# Patient Record
Sex: Female | Born: 1975 | Race: White | Hispanic: No | Marital: Married | Smoking: Never smoker
Health system: Southern US, Community
[De-identification: ages and names within clinical notes are randomized; demographics above are authoritative.]

## PROBLEM LIST (undated history)

## (undated) DIAGNOSIS — O09819 Supervision of pregnancy resulting from assisted reproductive technology, unspecified trimester: Secondary | ICD-10-CM

## (undated) DIAGNOSIS — E038 Other specified hypothyroidism: Secondary | ICD-10-CM

## (undated) DIAGNOSIS — I10 Essential (primary) hypertension: Secondary | ICD-10-CM

## (undated) DIAGNOSIS — F411 Generalized anxiety disorder: Secondary | ICD-10-CM

## (undated) DIAGNOSIS — O24410 Gestational diabetes mellitus in pregnancy, diet controlled: Secondary | ICD-10-CM

## (undated) DIAGNOSIS — J45909 Unspecified asthma, uncomplicated: Secondary | ICD-10-CM

## (undated) DIAGNOSIS — E063 Autoimmune thyroiditis: Secondary | ICD-10-CM

## (undated) DIAGNOSIS — E669 Obesity, unspecified: Secondary | ICD-10-CM

## (undated) DIAGNOSIS — E039 Hypothyroidism, unspecified: Secondary | ICD-10-CM

## (undated) HISTORY — PX: MYOMECTOMY: SHX85

---

## 2005-11-01 ENCOUNTER — Emergency Department (HOSPITAL_COMMUNITY): Admission: EM | Admit: 2005-11-01 | Discharge: 2005-11-01 | Payer: Self-pay | Admitting: Emergency Medicine

## 2008-05-16 IMAGING — US US PELVIS COMPLETE
1 series · 13 of 25 positions shown · non-contrast
Comparison: NONE

CLINICAL DATA: Sharp right-side pelvic pain. LMP 11/19/06. 

PELVIC ULTRASOUND

[Series 1: us pelvis · 0.33mm/px · 13 of 65 slices shown]
[im 1/65]
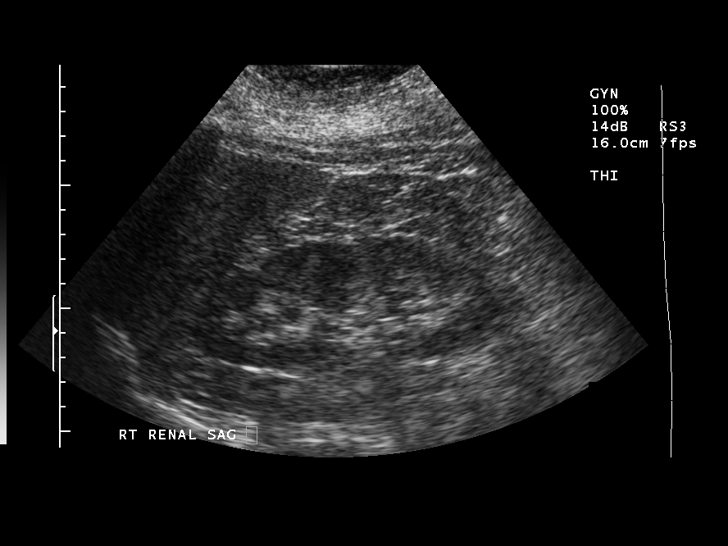
[im 6/65]
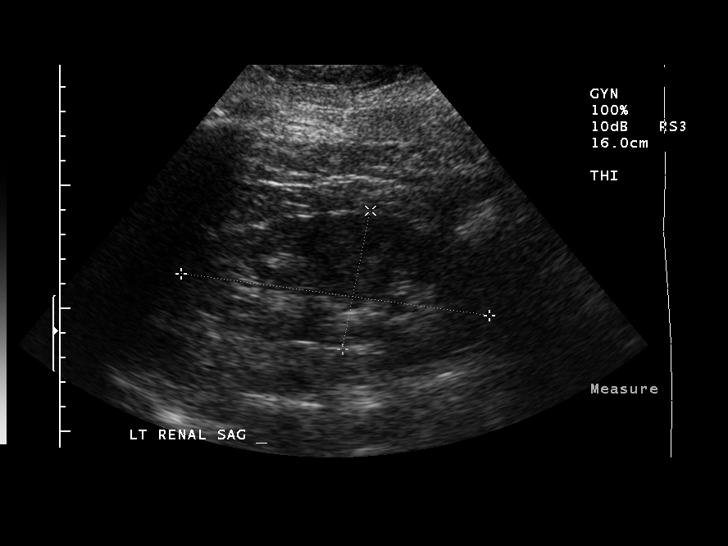
[im 11/65]
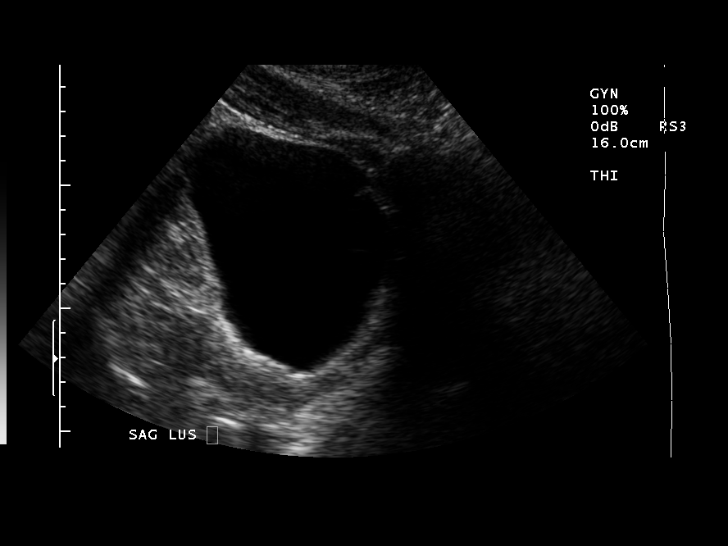
[im 17/65]
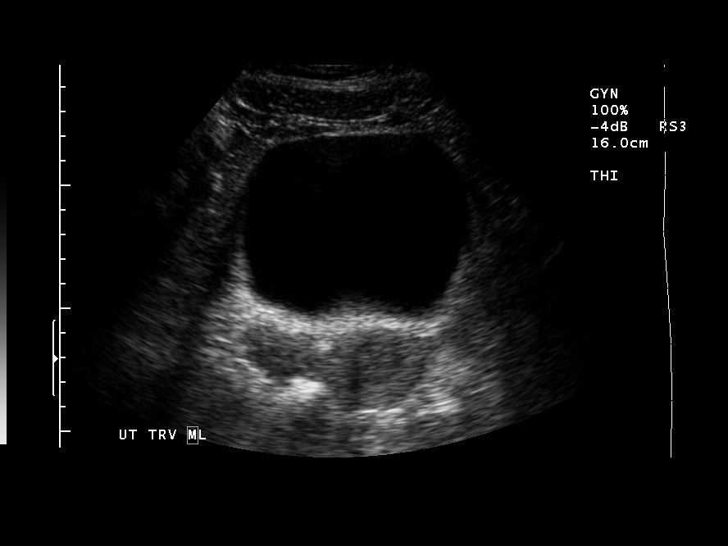
[im 22/65]
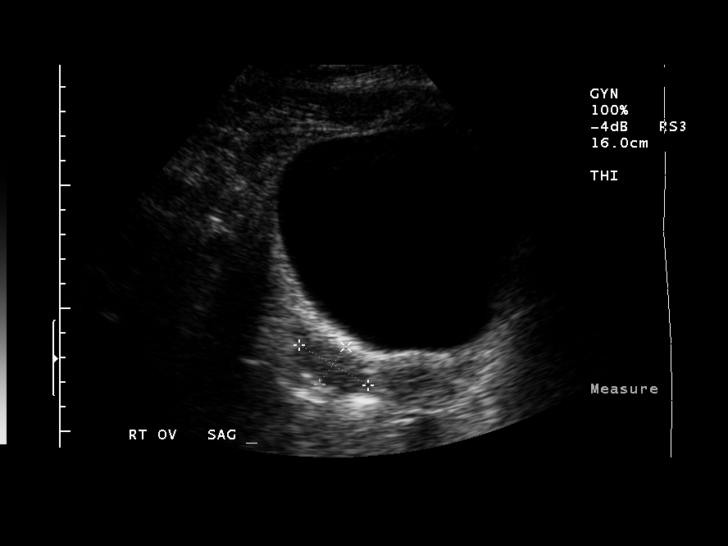
[im 27/65]
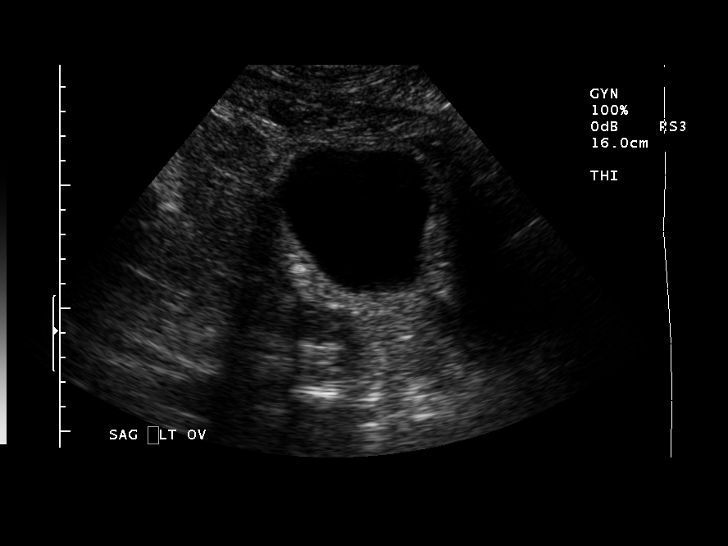
[im 33/65]
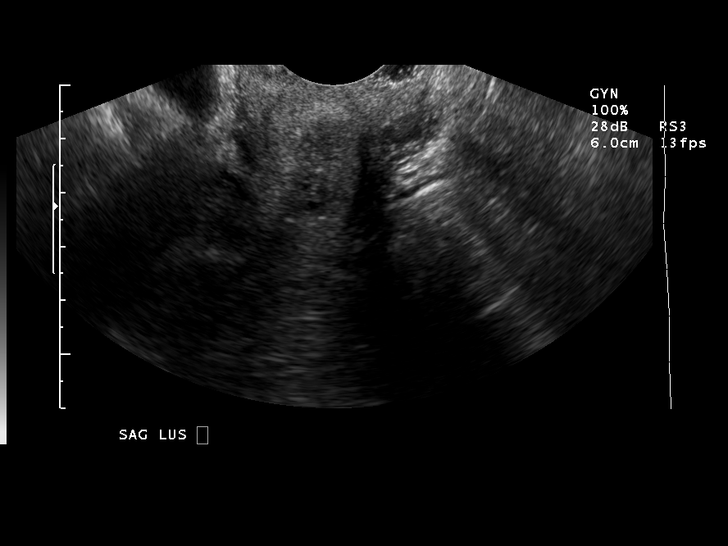
[im 38/65]
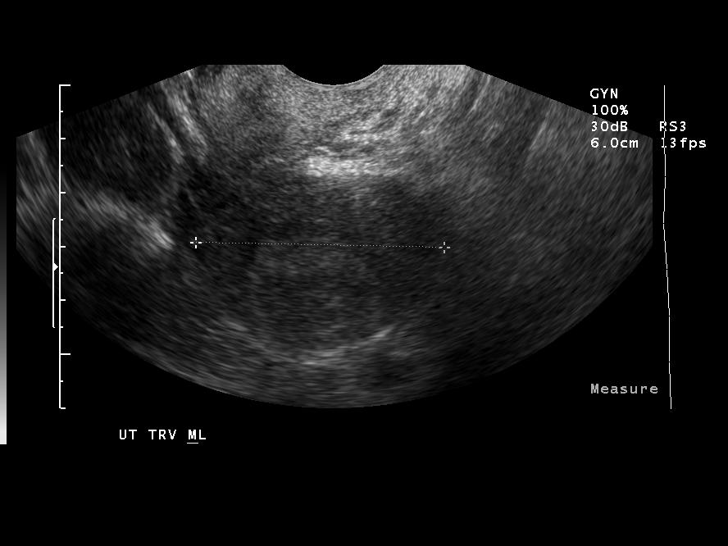
[im 43/65]
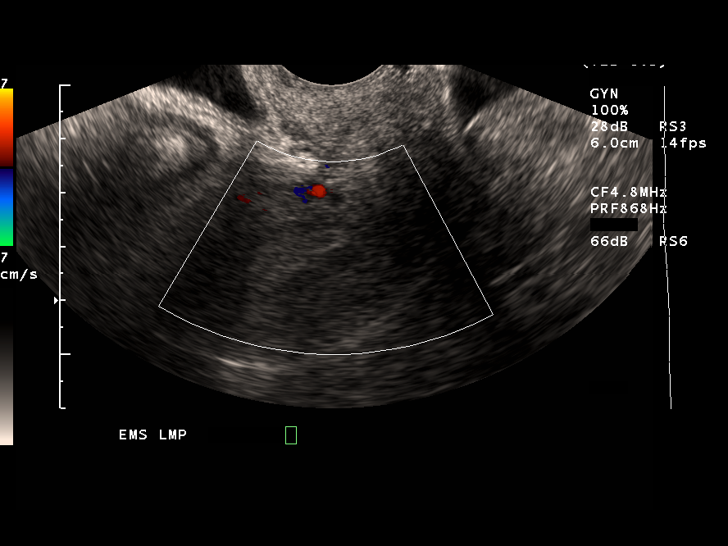
[im 49/65]
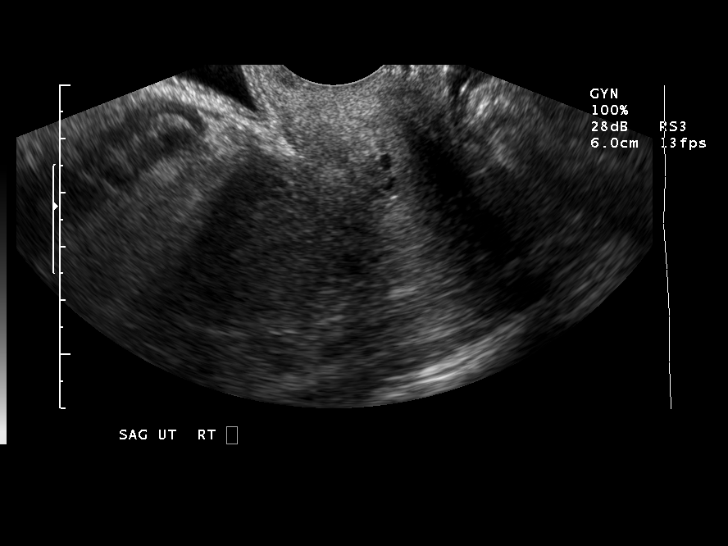
[im 54/65]
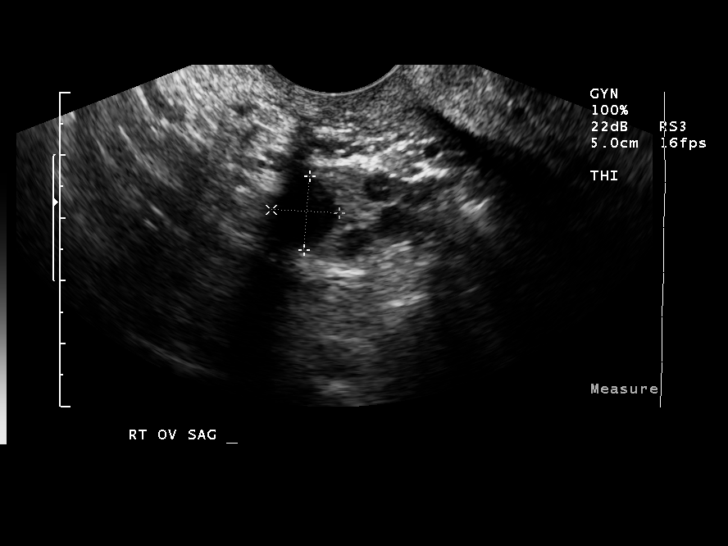
[im 59/65]
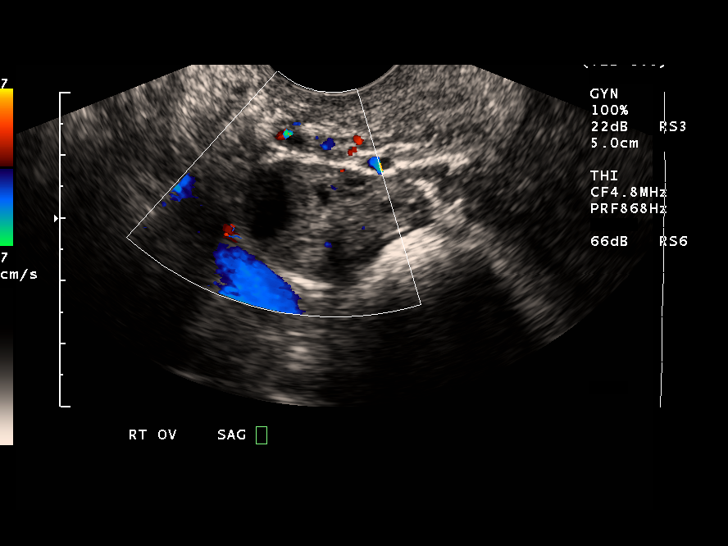
[im 65/65]
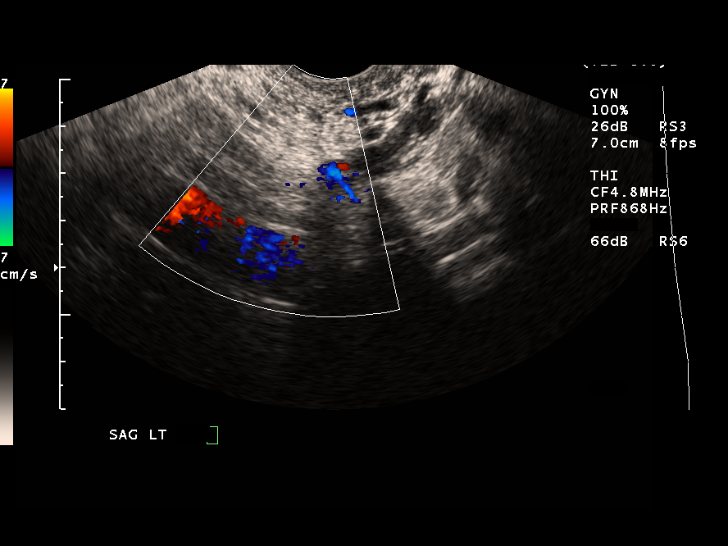

[13 of 25 positions shown; findings below may reference images not displayed]

FINDINGS: Transabdominal and transvaginal scans were performed. 
Limited views of both kidneys were normal. The uterus is normal in 
size measuring 7.8 x 3.8 x 4.7 cm in sagittal, AP, and transverse 
dimensions. The echo pattern is mildly heterogeneous. The 
endometrial stripe is normal in thickness and echo appearance 
measuring 0.81 cm. A tiny cervical cyst is noted. The left ovary 
was visualized on the transabdominal scan only and measured 3.0 x 
2.0 x 1.8 cm. Size was normal, but echo appearance was difficult 
to evaluate. The right ovary measured 3.1 x 1.9 x 1.8 cm. A 1.1-cm 
follicle was noted in the right ovary. Peripheral scattered 
calcifications were noted, which are most likely dystrophic. There 
were no pelvic fluid collections.
IMPRESSION: 1.1-cm right ovarian follicle or small cyst. Probable 
dystrophic calcifications in the right ovary. I do recommend a 
follow-up study to demonstrate stability of these calcifications 
with repeat exam in 3-6 months. Left ovary was not optimally 
visualized. Blain Jumper, M.D. electronically reviewed on 
12/17/2006 Dict Date: 12/17/2006  Tran Date: 12/17/2006 CAV  [REDACTED]

## 2016-11-05 LAB — OB RESULTS CONSOLE RUBELLA ANTIBODY, IGM: Rubella: IMMUNE

## 2016-11-05 LAB — OB RESULTS CONSOLE ABO/RH: RH TYPE: POSITIVE

## 2016-11-05 LAB — OB RESULTS CONSOLE HIV ANTIBODY (ROUTINE TESTING): HIV: NONREACTIVE

## 2016-11-05 LAB — OB RESULTS CONSOLE ANTIBODY SCREEN: Antibody Screen: NEGATIVE

## 2016-11-05 LAB — OB RESULTS CONSOLE RPR: RPR: NONREACTIVE

## 2016-11-05 LAB — OB RESULTS CONSOLE GC/CHLAMYDIA
CHLAMYDIA, DNA PROBE: NEGATIVE
Gonorrhea: NEGATIVE

## 2016-11-05 LAB — OB RESULTS CONSOLE HEPATITIS B SURFACE ANTIGEN: Hepatitis B Surface Ag: NEGATIVE

## 2017-02-13 ENCOUNTER — Inpatient Hospital Stay (HOSPITAL_COMMUNITY): Payer: 59

## 2017-02-13 ENCOUNTER — Inpatient Hospital Stay (HOSPITAL_COMMUNITY)
Admission: AD | Admit: 2017-02-13 | Discharge: 2017-02-21 | DRG: 788 | Disposition: A | Discharge status: Home / Self Care | Payer: 59 | Source: Ambulatory Visit | Attending: Obstetrics & Gynecology | Admitting: Obstetrics & Gynecology

## 2017-02-13 ENCOUNTER — Encounter: Payer: Self-pay | Admitting: Student

## 2017-02-13 DIAGNOSIS — J45909 Unspecified asthma, uncomplicated: Secondary | ICD-10-CM | POA: Diagnosis present

## 2017-02-13 DIAGNOSIS — E669 Obesity, unspecified: Secondary | ICD-10-CM | POA: Diagnosis present

## 2017-02-13 DIAGNOSIS — O99214 Obesity complicating childbirth: Secondary | ICD-10-CM | POA: Diagnosis present

## 2017-02-13 DIAGNOSIS — O9952 Diseases of the respiratory system complicating childbirth: Secondary | ICD-10-CM | POA: Diagnosis present

## 2017-02-13 DIAGNOSIS — O09512 Supervision of elderly primigravida, second trimester: Secondary | ICD-10-CM

## 2017-02-13 DIAGNOSIS — O9902 Anemia complicating childbirth: Secondary | ICD-10-CM | POA: Diagnosis present

## 2017-02-13 DIAGNOSIS — O09811 Supervision of pregnancy resulting from assisted reproductive technology, first trimester: Secondary | ICD-10-CM

## 2017-02-13 DIAGNOSIS — O09819 Supervision of pregnancy resulting from assisted reproductive technology, unspecified trimester: Secondary | ICD-10-CM

## 2017-02-13 DIAGNOSIS — O288 Other abnormal findings on antenatal screening of mother: Secondary | ICD-10-CM

## 2017-02-13 DIAGNOSIS — Z3A24 24 weeks gestation of pregnancy: Secondary | ICD-10-CM | POA: Diagnosis not present

## 2017-02-13 DIAGNOSIS — O99284 Endocrine, nutritional and metabolic diseases complicating childbirth: Secondary | ICD-10-CM | POA: Diagnosis present

## 2017-02-13 DIAGNOSIS — F411 Generalized anxiety disorder: Secondary | ICD-10-CM | POA: Diagnosis present

## 2017-02-13 DIAGNOSIS — I10 Essential (primary) hypertension: Secondary | ICD-10-CM | POA: Diagnosis present

## 2017-02-13 DIAGNOSIS — D649 Anemia, unspecified: Secondary | ICD-10-CM | POA: Diagnosis present

## 2017-02-13 DIAGNOSIS — O24420 Gestational diabetes mellitus in childbirth, diet controlled: Secondary | ICD-10-CM | POA: Diagnosis present

## 2017-02-13 DIAGNOSIS — N289 Disorder of kidney and ureter, unspecified: Secondary | ICD-10-CM | POA: Diagnosis present

## 2017-02-13 DIAGNOSIS — O119 Pre-existing hypertension with pre-eclampsia, unspecified trimester: Secondary | ICD-10-CM | POA: Diagnosis present

## 2017-02-13 DIAGNOSIS — O99344 Other mental disorders complicating childbirth: Secondary | ICD-10-CM | POA: Diagnosis present

## 2017-02-13 DIAGNOSIS — E039 Hypothyroidism, unspecified: Secondary | ICD-10-CM | POA: Diagnosis present

## 2017-02-13 DIAGNOSIS — O114 Pre-existing hypertension with pre-eclampsia, complicating childbirth: Principal | ICD-10-CM | POA: Diagnosis present

## 2017-02-13 DIAGNOSIS — O1410 Severe pre-eclampsia, unspecified trimester: Secondary | ICD-10-CM | POA: Diagnosis present

## 2017-02-13 DIAGNOSIS — O1412 Severe pre-eclampsia, second trimester: Secondary | ICD-10-CM

## 2017-02-13 DIAGNOSIS — O24410 Gestational diabetes mellitus in pregnancy, diet controlled: Secondary | ICD-10-CM | POA: Diagnosis present

## 2017-02-13 DIAGNOSIS — O1002 Pre-existing essential hypertension complicating childbirth: Secondary | ICD-10-CM | POA: Diagnosis present

## 2017-02-13 DIAGNOSIS — Z363 Encounter for antenatal screening for malformations: Secondary | ICD-10-CM

## 2017-02-13 DIAGNOSIS — O99213 Obesity complicating pregnancy, third trimester: Secondary | ICD-10-CM

## 2017-02-13 DIAGNOSIS — O321XX0 Maternal care for breech presentation, not applicable or unspecified: Secondary | ICD-10-CM | POA: Diagnosis present

## 2017-02-13 DIAGNOSIS — O09522 Supervision of elderly multigravida, second trimester: Secondary | ICD-10-CM

## 2017-02-13 HISTORY — DX: Hypothyroidism, unspecified: E03.9

## 2017-02-13 HISTORY — DX: Generalized anxiety disorder: F41.1

## 2017-02-13 HISTORY — DX: Supervision of pregnancy resulting from assisted reproductive technology, unspecified trimester: O09.819

## 2017-02-13 HISTORY — DX: Gestational diabetes mellitus in pregnancy, diet controlled: O24.410

## 2017-02-13 HISTORY — DX: Other specified hypothyroidism: E03.8

## 2017-02-13 HISTORY — DX: Autoimmune thyroiditis: E06.3

## 2017-02-13 HISTORY — DX: Obesity, unspecified: E66.9

## 2017-02-13 HISTORY — DX: Unspecified asthma, uncomplicated: J45.909

## 2017-02-13 HISTORY — DX: Essential (primary) hypertension: I10

## 2017-02-13 LAB — CBC
HEMATOCRIT: 38.6 % (ref 36.0–46.0)
Hemoglobin: 13.3 g/dL (ref 12.0–15.0)
MCH: 28.2 pg (ref 26.0–34.0)
MCHC: 34.5 g/dL (ref 30.0–36.0)
MCV: 81.8 fL (ref 78.0–100.0)
Platelets: 166 10*3/uL (ref 150–400)
RBC: 4.72 MIL/uL (ref 3.87–5.11)
RDW: 14 % (ref 11.5–15.5)
WBC: 15.3 10*3/uL — ABNORMAL HIGH (ref 4.0–10.5)

## 2017-02-13 LAB — COMPREHENSIVE METABOLIC PANEL
ALK PHOS: 81 U/L (ref 38–126)
ALT: 64 U/L — ABNORMAL HIGH (ref 14–54)
ANION GAP: 8 (ref 5–15)
AST: 55 U/L — ABNORMAL HIGH (ref 15–41)
Albumin: 2.8 g/dL — ABNORMAL LOW (ref 3.5–5.0)
BILIRUBIN TOTAL: 0.8 mg/dL (ref 0.3–1.2)
BUN: 11 mg/dL (ref 6–20)
CALCIUM: 8.5 mg/dL — AB (ref 8.9–10.3)
CO2: 15 mmol/L — ABNORMAL LOW (ref 22–32)
Chloride: 110 mmol/L (ref 101–111)
Creatinine, Ser: 0.84 mg/dL (ref 0.44–1.00)
GFR calc Af Amer: >60 mL/min (ref >60)
Glucose, Bld: 99 mg/dL (ref 65–99)
POTASSIUM: 4.5 mmol/L (ref 3.5–5.1)
Sodium: 133 mmol/L — ABNORMAL LOW (ref 135–145)
TOTAL PROTEIN: 5.9 g/dL — AB (ref 6.5–8.1)

## 2017-02-13 LAB — PROTEIN / CREATININE RATIO, URINE
CREATININE, URINE: 520 mg/dL
Protein Creatinine Ratio: 4.19 mg/mg{Cre} — ABNORMAL HIGH (ref 0.00–0.15)
TOTAL PROTEIN, URINE: 2179 mg/dL

## 2017-02-13 LAB — TYPE AND SCREEN
ABO/RH(D): O POS
ANTIBODY SCREEN: NEGATIVE

## 2017-02-13 LAB — URINALYSIS, ROUTINE W REFLEX MICROSCOPIC
Glucose, UA: NEGATIVE mg/dL
Ketones, ur: 15 mg/dL — AB
Leukocytes, UA: NEGATIVE
Nitrite: NEGATIVE
PH: 6 (ref 5.0–8.0)
Protein, ur: >300 mg/dL — AB
Specific Gravity, Urine: >1.03 — ABNORMAL HIGH (ref 1.005–1.030)

## 2017-02-13 LAB — URINALYSIS, MICROSCOPIC (REFLEX)

## 2017-02-13 LAB — URIC ACID: URIC ACID, SERUM: 4.3 mg/dL (ref 2.3–6.6)

## 2017-02-13 LAB — LACTATE DEHYDROGENASE: LDH: 350 U/L — ABNORMAL HIGH (ref 98–192)

## 2017-02-13 MED ORDER — PANTOPRAZOLE SODIUM 40 MG IV SOLR
40.0000 mg | INTRAVENOUS | Status: DC
  Administered 2017-02-13 – 2017-02-15 (×3): 40 mg via INTRAVENOUS
  Filled 2017-02-13 (×4): qty 40

## 2017-02-13 MED ORDER — NIFEDIPINE ER 30 MG PO TB24
30.0000 mg | ORAL_TABLET | Freq: Every day | ORAL | Status: DC
  Filled 2017-02-13: qty 1

## 2017-02-13 MED ORDER — MAGNESIUM SULFATE 40 G IN LACTATED RINGERS - SIMPLE
2.0000 g/h | INTRAVENOUS | Status: DC
Start: 2017-02-13 — End: 2017-02-16
  Administered 2017-02-14 – 2017-02-16 (×3): 2 g/h via INTRAVENOUS
  Filled 2017-02-13 (×3): qty 40
  Filled 2017-02-13: qty 500

## 2017-02-13 MED ORDER — HYDRALAZINE HCL 20 MG/ML IJ SOLN
10.0000 mg | Freq: Once | INTRAMUSCULAR | Status: AC | PRN
  Administered 2017-02-13: 10 mg via INTRAVENOUS
  Filled 2017-02-13: qty 1

## 2017-02-13 MED ORDER — LABETALOL HCL 5 MG/ML IV SOLN
20.0000 mg | INTRAVENOUS | Status: AC | PRN
  Administered 2017-02-13: 80 mg via INTRAVENOUS
  Administered 2017-02-13: 20 mg via INTRAVENOUS
  Administered 2017-02-13: 40 mg via INTRAVENOUS
  Filled 2017-02-13: qty 16
  Filled 2017-02-13: qty 4
  Filled 2017-02-13: qty 8

## 2017-02-13 MED ORDER — LACTATED RINGERS IV SOLN
INTRAVENOUS | Status: DC
  Administered 2017-02-14 – 2017-02-17 (×6): via INTRAVENOUS

## 2017-02-13 MED ORDER — PRENATAL MULTIVITAMIN CH
1.0000 | ORAL_TABLET | Freq: Every day | ORAL | Status: DC
  Filled 2017-02-13 (×3): qty 1

## 2017-02-13 MED ORDER — MAGNESIUM SULFATE 40 G IN LACTATED RINGERS - SIMPLE
2.0000 g/h | Freq: Once | INTRAVENOUS | Status: AC
  Administered 2017-02-13: 2 g/h via INTRAVENOUS
  Filled 2017-02-13: qty 40

## 2017-02-13 MED ORDER — HYDRALAZINE HCL 20 MG/ML IJ SOLN
INTRAMUSCULAR | Status: AC
  Filled 2017-02-13: qty 1

## 2017-02-13 MED ORDER — SODIUM CHLORIDE 0.9 % IV SOLN
INTRAVENOUS | Status: DC
  Administered 2017-02-13: 21:00:00 via INTRAVENOUS

## 2017-02-13 MED ORDER — ONDANSETRON HCL 4 MG/2ML IJ SOLN
4.0000 mg | Freq: Once | INTRAMUSCULAR | Status: AC
  Administered 2017-02-13: 4 mg via INTRAVENOUS
  Filled 2017-02-13: qty 2

## 2017-02-13 MED ORDER — BETAMETHASONE SOD PHOS & ACET 6 (3-3) MG/ML IJ SUSP
12.0000 mg | INTRAMUSCULAR | Status: DC
  Filled 2017-02-13: qty 2

## 2017-02-13 MED ORDER — CALCIUM CARBONATE ANTACID 500 MG PO CHEW: 2.0000 | CHEWABLE_TABLET | ORAL | Status: DC | PRN

## 2017-02-13 MED ORDER — MAGNESIUM SULFATE 40 G IN LACTATED RINGERS - SIMPLE: 2.0000 g/h | Freq: Once | INTRAVENOUS | Status: DC

## 2017-02-13 MED ORDER — ACETAMINOPHEN 500 MG PO TABS
1000.0000 mg | ORAL_TABLET | Freq: Once | ORAL | Status: AC
  Administered 2017-02-13: 1000 mg via ORAL
  Filled 2017-02-13: qty 2

## 2017-02-13 MED ORDER — LACTATED RINGERS IV BOLUS (SEPSIS)
1000.0000 mL | Freq: Once | INTRAVENOUS | Status: DC
  Administered 2017-02-13: 1000 mL via INTRAVENOUS

## 2017-02-13 MED ORDER — ZOLPIDEM TARTRATE 5 MG PO TABS
5.0000 mg | ORAL_TABLET | Freq: Every evening | ORAL | Status: DC | PRN
  Administered 2017-02-13 – 2017-02-20 (×7): 5 mg via ORAL
  Filled 2017-02-13 (×7): qty 1

## 2017-02-13 MED ORDER — BETAMETHASONE SOD PHOS & ACET 6 (3-3) MG/ML IJ SUSP
12.0000 mg | Freq: Once | INTRAMUSCULAR | Status: AC
  Administered 2017-02-14: 12 mg via INTRAMUSCULAR
  Filled 2017-02-13: qty 2

## 2017-02-13 MED ORDER — ALPRAZOLAM 0.5 MG PO TABS
0.5000 mg | ORAL_TABLET | Freq: Once | ORAL | Status: AC
  Administered 2017-02-14: 0.5 mg via ORAL
  Filled 2017-02-13: qty 1

## 2017-02-13 MED ORDER — LABETALOL HCL 200 MG PO TABS
400.0000 mg | ORAL_TABLET | Freq: Three times a day (TID) | ORAL | Status: DC
  Administered 2017-02-13 – 2017-02-16 (×8): 400 mg via ORAL
  Filled 2017-02-13 (×9): qty 2

## 2017-02-13 MED ORDER — DOCUSATE SODIUM 100 MG PO CAPS
100.0000 mg | ORAL_CAPSULE | Freq: Every day | ORAL | Status: DC
  Administered 2017-02-14: 100 mg via ORAL
  Filled 2017-02-13 (×5): qty 1

## 2017-02-13 MED ORDER — BETAMETHASONE SOD PHOS & ACET 6 (3-3) MG/ML IJ SUSP
12.0000 mg | Freq: Once | INTRAMUSCULAR | Status: AC
Start: 2017-02-13 — End: 2017-02-13
  Administered 2017-02-13: 12 mg via INTRAMUSCULAR
  Filled 2017-02-13: qty 2

## 2017-02-13 MED ORDER — ACETAMINOPHEN 325 MG PO TABS: 650.0000 mg | ORAL_TABLET | ORAL | Status: DC | PRN

## 2017-02-13 NOTE — MAU Note (Signed)
Pt reports back pain since yesterday, upper abd pain started later. Vomiting today. Denies diarrhea or fever.

## 2017-02-13 NOTE — H&P (Signed)
Chief complaint: Nausea vomiting headache and hypertension  History of present illness: 42 year old G1 at 24 weeks and 2 days with history of chronic hypertension presents with multiple symptoms consistent with severe preeclampsia. Patient has had her chronic hypertension well controlled with clobetasol 200 mg by mouth twice a day. She reports home blood pressures 130s over 80s but has not taken her blood pressure today. She took her daytime dose of labetalol but had emesis immediately thereafter and thinks she did not absorb her medication. Patient notes headache starting last night which did show some response to Tylenol however her headache today did not respond to Tylenol with codeine. Patient thinks her headache is stemming from her nausea. Patient notes nausea starting last night with initial response to Zofran. Patient repeated Zofran today but this did not help her nausea and she notes continued emesis and "unable to keep anything down". Patient reports no scotomata. Patient is not on any medications for reflux. Patient notes history of whitecoat hypertension and her blood pressures during her outpatient obstetric visit have been markedly higher than her reported blood pressures at home. Patient has a history of anxiety but is not currently on any antianxiety medications. Patient notes no contractions, no leakage of fluid, active fetal movement and no vaginal bleeding. Patient does note upper epigastric pain and back pain. The back pain has been responsive to the codeine earlier today.   Prenatal issues:  - IVF pregnancy. Patient did not do preimplantation genetic diagnosis - Advanced maternal age at 48. Normal NT screen, declined spell free fetal DNA - Hypothyroid. Followed at [facility] integrative therapy with stable TSH early January - Chronic hypertension with whitecoat component. Patient was on Carver Dyal at the start of the pregnancy but discontinued this around 10 weeks. Her home blood  pressures have been under adequate control per patient's blood pressure log on labetalol 200 mg twice a day. Given her chronic hypertension patient had baseline labs for preeclampsia at the start of December. At that time her creatinine was 0.95 her AST was 20 her ALT was 29 her hemoglobin was 13.0 and her platelets were 311. Her baseline 24-hour urine on January 4 showed 284 mg of protein. - Early subchorionic hemorrhage. No bleeding through second trimester - Placenta previa on 16 and 19 week ultrasound. This has not yet been reassessed - Insulin resistance. Patient had an early 1 hour GTT of 180. She has been checking home blood sugars and they have been under good control on no medications. Patient used 250 mg of metformin for a short time - Asthma. No active issues - Obesity  Medications: Labetalol, prenatal vitamin, Zyrtec, baby aspirin Allergies: Penicillin, sulfa, tetracycline, shellfish  Past Medical History:  Diagnosis Date  . Asthma   . Chronic hypertension   . Hypothyroidism due to Hashimoto's thyroiditis     Past Surgical History:  Procedure Laterality Date  . MYOMECTOMY      PE: Vitals:   02/13/17 1914 02/13/17 1930 02/13/17 1945 02/13/17 2000  BP: (!) 190/121 (!) 182/108 (!) 186/115 (!) 173/101  Pulse:  79 89 87  Resp:      Temp:      TempSrc:      SpO2:      Weight:      Height:       General: Laying in bed in no acute distress, no significant anxiety apparent Cardiovascular: Regular rate and rhythm, no murmur rub or gallop Pulmonary: Clear to auscultation Abdomen: No fundal tenderness, no abdominal  tenderness, no right upper quadrant pain Lower extremity: 3+ edema, 2+ DTR, no clonus GU: Not assessed (placenta previa) Toco: None NST: 140s, 10 beat variability, possible variable decelerations though difficult to follow due to early gestational age and maternal habitus  CBC    Component Value Date/Time   WBC 15.3 (H) 02/13/2017 1915   RBC 4.72 02/13/2017  1915   HGB 13.3 02/13/2017 1915   HCT 38.6 02/13/2017 1915   PLT 166 02/13/2017 1915   MCV 81.8 02/13/2017 1915   MCH 28.2 02/13/2017 1915   MCHC 34.5 02/13/2017 1915   RDW 14.0 02/13/2017 1915    CMP Latest Ref Rng & Units 02/13/2017  Glucose 65 - 99 mg/dL 99  BUN 6 - 20 mg/dL 11  Creatinine 0.980.44 - 1.191.00 mg/dL 1.470.84  Sodium 829135 - 562145 mmol/L 133(L)  Potassium 3.5 - 5.1 mmol/L 4.5  Chloride 101 - 111 mmol/L 110  CO2 22 - 32 mmol/L 15(L)  Calcium 8.9 - 10.3 mg/dL 1.3(Y8.5(L)  Total Protein 6.5 - 8.1 g/dL 5.9(L)  Total Bilirubin 0.3 - 1.2 mg/dL 0.8  Alkaline Phos 38 - 126 U/L 81  AST 15 - 41 U/L 55(H)  ALT 14 - 54 U/L 64(H)    Assessment plan: 42 year old G1 at 24 weeks and 2 days gestation with chronic hypertension, whitecoat hypertension, obesity, thyroid disease, insulin resistance here with severe preeclampsia. Patient with headache and elevated blood pressures. Currently attempting to control blood pressures with IV labetalol. She has had 20, 40, 80 mg of labetalol. Blood pressures failed to resolve will move to IV hydralazine. Patient has received 1 dose of betamethasone. We will start magnesium sulfate. I suspect there is an anxiety component her blood pressures and we will give her some Xanax. She will get Zofran and protonic study help with her nausea and acid symptoms. We'll keep the fetus on the monitor until we get a bedside ultrasound for placental location growth AFI, BPP and Doppler studies. Due to the early gestational age all attempts will be made for expectant management. Will repeat labs in 6 hours for stability. I discussed with patient showed her blood pressures be unable to be controlled, any seizures or worsening neurologic symptoms or worsening labs we may consider delivery. All attempts were made to complete betamethasone prior to delivery. Due to early gestational age this would be via primary cesarean section.  Lendon ColonelKelly A Kalan Rinn 02/13/2017 8:13 PM

## 2017-02-13 NOTE — Progress Notes (Signed)
Subjective: Patient notes continues to be worried about her situation but anxiety slightly diminished after Xanax and Ambien. Patient is thirsty andcomplains of dry air in the room. Patient notes headache improved.  O: Vitals:   02/13/17 2136 02/13/17 2142 02/13/17 2202 02/13/17 2222  BP:  (!) 174/96 (!) 166/90 (!) 184/105  Pulse:  87 86 92  Resp: 16 16    Temp: 97.6 F (36.4 C)     TempSrc: Oral     SpO2:      Weight: 116.6 kg (257 lb)     Height: 5\' 5"  (1.651 m)      Gen.: Well-appearing, no distress Toco: None :FH: 150s 5-10 beat variability, no decelerations,appropriate for gestational age  Urine protein creatinine: 4.19 Bedside ultrasound: Formal report not yet available but on my interpretation of the images: No longer placenta previa, good fetal movement and grossly normal AFI. AGA  Assessment plan: Severe preeclampsia. Patient is aware of her situation with her very high blood pressures. Patient continues to assert that her blood pressures are due to anxiety and white coat syndrome. We discussed that she has a diagnosis of preeclampsia as evidence by her urinary protein and elevated LFTs. Her platelets are also below her starting platelets count with a drop from 300-160.Patient is resistant to the idea of the delivery. Since moving to the labor floor her pressures have been under better control after receiving labetalol 20 40, 80 and 10 mg of IV hydralazine. Recent bump in blood pressure was while we were discussing her plan of care. Will repeat blood pressure and one hour if this remains elevated we will repeat the IV hydralazine. We will also start the patient on labetalol 400 mg every 8 hours. Will hold Procardia for now given the magnesium. Patient aware mode of delivery would be by C-section and we would move to delivery with persistently high blood pressures, maternal symptoms, worsening kidney function or fetal distress. Labs be repeated 6 hours from her last and if they are  stable move to every 12 hours,  Lendon ColonelKelly A Jaleen Grupp 02/13/2017 10:57 PM

## 2017-02-13 NOTE — MAU Provider Note (Signed)
History     CSN: [id number]  Arrival date and time: 02/13/17 1827   First Provider Initiated Contact with Patient 02/13/17 1848      Chief Complaint  Patient presents with  . Abdominal Pain  . Back Pain  . Emesis   HPI  Natasha Caldwell is a 42 y.o. G1P0 at 7178w2d who presents with n/v, epigastric pain, & headache. Pregnancy complicated by chronic hypertension & is currently taking labetalol 200 mg BID. Current symptoms began last night with mid back pain. Reports pain radiated to upper abdomen & earlier today had epigastric pain that was constant/throbbing. States pain has improved without intervention & currently rates pain 4/10. Started vomiting this morning. States she has vomited numerous times today and continues to be nauseated. Thinks she vomited her medication up today. Headache started more recently; thinks headache is due to vomiting. Denies visual disturbance. No sick contacts. Temp 99 at home. Positive fetal movement.   OB History    Gravida Para Term Preterm AB Living   1             SAB TAB Ectopic Multiple Live Births                  Past Medical History:  Diagnosis Date  . Asthma   . Chronic hypertension   . Hypothyroidism due to Hashimoto's thyroiditis     Past Surgical History:  Procedure Laterality Date  . MYOMECTOMY      Family History  Problem Relation Age of Onset  . Leukemia Sister   . Diabetes Maternal Grandfather   . Heart attack Maternal Grandfather   . Diabetes Paternal Grandfather   . Heart attack Paternal Grandfather     Social History   Tobacco Use  . Smoking status: Not on file  Substance Use Topics  . Alcohol use: Not on file  . Drug use: Not on file    Allergies:  Allergies  Allergen Reactions  . Monistat [Miconazole] Hives  . Penicillins Hives  . Shellfish Allergy Hives  . Sulfa Antibiotics Hives  . Tetracyclines & Related Hives    Medications Prior to Admission  Medication Sig Dispense Refill Last Dose  .  cholecalciferol (VITAMIN D) 1000 units tablet Take 1,000 Units by mouth daily.     Marland Kitchen. labetalol (NORMODYNE) 200 MG tablet Take 200 mg by mouth 2 (two) times daily.   02/13/2017 at Unknown time  . metFORMIN (GLUCOPHAGE) 500 MG tablet Take 250 mg by mouth daily.       Review of Systems  Constitutional: Negative.   Eyes: Negative for visual disturbance.  Gastrointestinal: Positive for abdominal pain, nausea and vomiting. Negative for constipation and diarrhea.  Genitourinary: Negative.   Musculoskeletal: Positive for back pain.  Neurological: Positive for headaches.   Physical Exam   Blood pressure (!) 210/117, pulse 89, temperature 98.2 F (36.8 C), temperature source Oral, resp. rate 16, height 5\' 5"  (1.651 m), weight 257 lb (116.6 kg), SpO2 99 %. Patient Vitals for the past 24 hrs:  BP Temp Temp src Pulse Resp SpO2 Height Weight  02/13/17 1930 (!) 182/108 - - 79 - - - -  02/13/17 1914 (!) 190/121 - - - - - - -  02/13/17 1902 (!) 177/108 - - 86 - 97 % - -  02/13/17 1841 (!) 210/117 98.2 F (36.8 C) Oral 89 16 99 % 5\' 5"  (1.651 m) 257 lb (116.6 kg)    Physical Exam  Nursing note and vitals reviewed. Constitutional: She  is oriented to person, place, and time. She appears well-developed and well-nourished. No distress.  HENT:  Head: Normocephalic and atraumatic.  Eyes: Conjunctivae are normal. Right eye exhibits no discharge. Left eye exhibits no discharge. No scleral icterus.  Neck: Normal range of motion.  Cardiovascular: Normal rate, regular rhythm and normal heart sounds.  No murmur heard. Respiratory: Effort normal and breath sounds normal. No respiratory distress. She has no wheezes.  GI: Soft. There is tenderness in the right upper quadrant. There is no CVA tenderness and negative Murphy's sign.  Musculoskeletal: She exhibits edema (BLE).  Neurological: She is alert and oriented to person, place, and time. She has normal reflexes.  No clonus  Skin: Skin is warm and dry. She is  not diaphoretic.  Psychiatric: She has a normal mood and affect. Her behavior is normal. Judgment and thought content normal.    MAU Course  Procedures Results for orders placed or performed during the hospital encounter of 02/13/17 (from the past 24 hour(s))  Urinalysis, Routine w reflex microscopic     Status: Abnormal   Collection Time: 02/13/17  6:44 PM  Result Value Ref Range   Color, Urine YELLOW YELLOW   APPearance CLEAR CLEAR   Specific Gravity, Urine >1.030 (H) 1.005 - 1.030   pH 6.0 5.0 - 8.0   Glucose, UA NEGATIVE NEGATIVE mg/dL   Hgb urine dipstick SMALL (A) NEGATIVE   Bilirubin Urine SMALL (A) NEGATIVE   Ketones, ur 15 (A) NEGATIVE mg/dL   Protein, ur >161 (A) NEGATIVE mg/dL   Nitrite NEGATIVE NEGATIVE   Leukocytes, UA NEGATIVE NEGATIVE  Urinalysis, Microscopic (reflex)     Status: Abnormal   Collection Time: 02/13/17  6:44 PM  Result Value Ref Range   RBC / HPF 0-5 0 - 5 RBC/hpf   WBC, UA 0-5 0 - 5 WBC/hpf   Bacteria, UA FEW (A) NONE SEEN   Squamous Epithelial / LPF 0-5 (A) NONE SEEN  CBC     Status: Abnormal   Collection Time: 02/13/17  7:15 PM  Result Value Ref Range   WBC 15.3 (H) 4.0 - 10.5 K/uL   RBC 4.72 3.87 - 5.11 MIL/uL   Hemoglobin 13.3 12.0 - 15.0 g/dL   HCT 09.6 04.5 - 40.9 %   MCV 81.8 78.0 - 100.0 fL   MCH 28.2 26.0 - 34.0 pg   MCHC 34.5 30.0 - 36.0 g/dL   RDW 81.1 91.4 - 78.2 %   Platelets 166 150 - 400 K/uL    MDM NST:  Baseline: 150 bpm, Variability: Good {> 6 bpm), Accelerations: Non-reactive but appropriate for gestational age and Decelerations: Intermittent tracing, due to gestational age vs maternal body habitus Preeclampsia labs & IV antihypertensive protocol ordered for severe range BPs Called Dr. Ernestina Penna & notified of patient's presentation. Will tx nausea & headache & give BMZ while in MAU. She will come see patient Zofran 4 mg IV, tylenol 1 gm PO, & BMZ ordered Assessment and Plan  A: Chronic hypertension during  pregnancy  P: Dr. Ernestina Penna on unit   Judeth Horn 02/13/2017, 7:02 PM

## 2017-02-14 ENCOUNTER — Other Ambulatory Visit: Payer: Self-pay

## 2017-02-14 LAB — COMPREHENSIVE METABOLIC PANEL
ALBUMIN: 2.8 g/dL — AB (ref 3.5–5.0)
ALK PHOS: 80 U/L (ref 38–126)
ALK PHOS: 77 U/L (ref 38–126)
ALT: 60 U/L — AB (ref 14–54)
ALT: 53 U/L (ref 14–54)
ALT: 51 U/L (ref 14–54)
ANION GAP: 8 (ref 5–15)
ANION GAP: 9 (ref 5–15)
AST: 39 U/L (ref 15–41)
AST: 33 U/L (ref 15–41)
AST: 33 U/L (ref 15–41)
Albumin: 2.8 g/dL — ABNORMAL LOW (ref 3.5–5.0)
Albumin: 2.9 g/dL — ABNORMAL LOW (ref 3.5–5.0)
Alkaline Phosphatase: 73 U/L (ref 38–126)
Anion gap: 13 (ref 5–15)
BILIRUBIN TOTAL: 0.3 mg/dL (ref 0.3–1.2)
BUN: 12 mg/dL (ref 6–20)
BUN: 16 mg/dL (ref 6–20)
BUN: 18 mg/dL (ref 6–20)
CALCIUM: 7.8 mg/dL — AB (ref 8.9–10.3)
CALCIUM: 7.7 mg/dL — AB (ref 8.9–10.3)
CHLORIDE: 109 mmol/L (ref 101–111)
CHLORIDE: 100 mmol/L — AB (ref 101–111)
CO2: 16 mmol/L — AB (ref 22–32)
CO2: 17 mmol/L — ABNORMAL LOW (ref 22–32)
CO2: 17 mmol/L — AB (ref 22–32)
CREATININE: 0.88 mg/dL (ref 0.44–1.00)
CREATININE: 1.15 mg/dL — AB (ref 0.44–1.00)
CREATININE: 1.17 mg/dL — AB (ref 0.44–1.00)
Calcium: 6.8 mg/dL — ABNORMAL LOW (ref 8.9–10.3)
Chloride: 105 mmol/L (ref 101–111)
GFR calc Af Amer: >60 mL/min (ref >60)
GFR calc non Af Amer: >60 mL/min (ref >60)
GFR calc non Af Amer: 58 mL/min — ABNORMAL LOW (ref >60)
GFR, EST NON AFRICAN AMERICAN: 57 mL/min — AB (ref >60)
GLUCOSE: 137 mg/dL — AB (ref 65–99)
Glucose, Bld: 166 mg/dL — ABNORMAL HIGH (ref 65–99)
Glucose, Bld: 151 mg/dL — ABNORMAL HIGH (ref 65–99)
POTASSIUM: 4.2 mmol/L (ref 3.5–5.1)
Potassium: 4.4 mmol/L (ref 3.5–5.1)
Potassium: 4.4 mmol/L (ref 3.5–5.1)
SODIUM: 133 mmol/L — AB (ref 135–145)
SODIUM: 130 mmol/L — AB (ref 135–145)
Sodium: 131 mmol/L — ABNORMAL LOW (ref 135–145)
TOTAL PROTEIN: 5.8 g/dL — AB (ref 6.5–8.1)
Total Bilirubin: 0.4 mg/dL (ref 0.3–1.2)
Total Bilirubin: 0.2 mg/dL — ABNORMAL LOW (ref 0.3–1.2)
Total Protein: 6.4 g/dL — ABNORMAL LOW (ref 6.5–8.1)
Total Protein: 6.3 g/dL — ABNORMAL LOW (ref 6.5–8.1)

## 2017-02-14 LAB — CBC WITH DIFFERENTIAL/PLATELET
BASOS PCT: 0 %
Basophils Absolute: 0 10*3/uL (ref 0.0–0.1)
EOS ABS: 0 10*3/uL (ref 0.0–0.7)
EOS PCT: 0 %
HCT: 39.1 % (ref 36.0–46.0)
HEMOGLOBIN: 13.3 g/dL (ref 12.0–15.0)
Lymphocytes Relative: 9 %
Lymphs Abs: 1.5 10*3/uL (ref 0.7–4.0)
MCH: 28 pg (ref 26.0–34.0)
MCHC: 34 g/dL (ref 30.0–36.0)
MCV: 82.3 fL (ref 78.0–100.0)
Monocytes Absolute: 0 10*3/uL — ABNORMAL LOW (ref 0.1–1.0)
Monocytes Relative: 0 %
NEUTROS PCT: 91 %
Neutro Abs: 14.5 10*3/uL — ABNORMAL HIGH (ref 1.7–7.7)
PLATELETS: 169 10*3/uL (ref 150–400)
RBC: 4.75 MIL/uL (ref 3.87–5.11)
RDW: 14.3 % (ref 11.5–15.5)
WBC: 16 10*3/uL — AB (ref 4.0–10.5)

## 2017-02-14 LAB — CBC
HCT: 37.8 % (ref 36.0–46.0)
HCT: 37.2 % (ref 36.0–46.0)
HEMATOCRIT: 36.6 % (ref 36.0–46.0)
HEMOGLOBIN: 12.7 g/dL (ref 12.0–15.0)
Hemoglobin: 12.3 g/dL (ref 12.0–15.0)
Hemoglobin: 12.6 g/dL (ref 12.0–15.0)
MCH: 27.9 pg (ref 26.0–34.0)
MCH: 28.3 pg (ref 26.0–34.0)
MCH: 28.2 pg (ref 26.0–34.0)
MCHC: 33.6 g/dL (ref 30.0–36.0)
MCHC: 33.6 g/dL (ref 30.0–36.0)
MCHC: 33.9 g/dL (ref 30.0–36.0)
MCV: 82.9 fL (ref 78.0–100.0)
MCV: 83.4 fL (ref 78.0–100.0)
MCV: 83.9 fL (ref 78.0–100.0)
PLATELETS: 186 10*3/uL (ref 150–400)
Platelets: 188 10*3/uL (ref 150–400)
Platelets: 195 10*3/uL (ref 150–400)
RBC: 4.56 MIL/uL (ref 3.87–5.11)
RBC: 4.46 MIL/uL (ref 3.87–5.11)
RBC: 4.36 MIL/uL (ref 3.87–5.11)
RDW: 14.3 % (ref 11.5–15.5)
RDW: 14.5 % (ref 11.5–15.5)
RDW: 14.6 % (ref 11.5–15.5)
WBC: 17.8 10*3/uL — ABNORMAL HIGH (ref 4.0–10.5)
WBC: 19.6 10*3/uL — AB (ref 4.0–10.5)
WBC: 19.3 10*3/uL — AB (ref 4.0–10.5)

## 2017-02-14 LAB — URIC ACID: Uric Acid, Serum: 5 mg/dL (ref 2.3–6.6)

## 2017-02-14 LAB — ABO/RH: ABO/RH(D): O POS

## 2017-02-14 MED ORDER — SODIUM CHLORIDE 0.9 % IV SOLN
8.0000 mg | Freq: Two times a day (BID) | INTRAVENOUS | Status: DC | PRN
  Administered 2017-02-14 – 2017-02-16 (×3): 8 mg via INTRAVENOUS
  Filled 2017-02-14 (×4): qty 4

## 2017-02-14 MED ORDER — ONDANSETRON HCL 4 MG PO TABS
8.0000 mg | ORAL_TABLET | Freq: Two times a day (BID) | ORAL | Status: DC | PRN
  Administered 2017-02-14: 8 mg via ORAL
  Filled 2017-02-14: qty 2

## 2017-02-14 NOTE — Progress Notes (Signed)
Cardio adj x 30 minutes, FHR 140's, audible FHR sounds.

## 2017-02-14 NOTE — Progress Notes (Signed)
No h/a, vision changes, ruq pain Just got off phone texting friend and became very anxious, more calm now and trying to relax  Patient Vitals for the past 24 hrs:  BP Temp Temp src Pulse Resp SpO2 Weight  02/14/17 2104 (!) 151/86 98 F (36.7 C) Oral 82 20 - -  02/14/17 1741 (!) 143/82 98.5 F (36.9 C) Oral 80 20 - -  02/14/17 1731 - - - - 18 - -  02/14/17 1459 (!) 152/88 - - 87 18 - -  02/14/17 1411 (!) 158/93 - - 85 18 - -  02/14/17 1141 (!) 146/82 - - 80 18 - -  02/14/17 1129 - - - - - - 256 lb 6.4 oz (116.3 kg)  02/14/17 1103 - - - - 18 - -  02/14/17 1017 (!) 157/91 98.1 F (36.7 C) Axillary 82 19 - -  02/14/17 1000 - - - - 18 - -  02/14/17 0950 - - - - 20 - -  02/14/17 0841 (!) 144/87 - - 77 18 - -  02/14/17 0744 (!) 157/91 - - 86 18 - -  02/14/17 0741 - - - - - 97 % -  02/14/17 0736 - - - - - 98 % -  02/14/17 0731 - - - - - 98 % -  02/14/17 0726 - - - - - 97 % -  02/14/17 0725 (!) 179/104 - - 87 - - -  02/14/17 0723 (!) 179/104 98.1 F (36.7 C) Oral 85 18 95 % -  02/14/17 0645 - - - - - 98 % -  02/14/17 0630 - - - - - 97 % -  02/14/17 0615 - - - - - 97 % -  02/14/17 0600 - - - - - 97 % -  02/14/17 0545 - - - - - 97 % -  02/14/17 0530 - - - - - 96 % -  02/14/17 0521 (!) 159/92 - - 87 16 96 % -  02/14/17 0515 - - - - - 96 % -  02/14/17 0409 (!) 151/92 - - 85 16 - -  02/14/17 0304 (!) 154/91 - - 87 16 - -  02/14/17 0200 (!) 161/93 98.4 F (36.9 C) Oral 90 16 - -  02/14/17 0100 (!) 165/98 - - 88 16 - -  02/14/17 0002 (!) 162/92 - - 88 16 - -  02/14/17 0001 - - - - - 98 % -  02/13/17 2356 - - - - - 98 % -  02/13/17 2352 (!) 171/99 - - 90 16 - -  02/13/17 2342 (!) 171/99 - - 85 - - -  02/13/17 2322 (!) 176/102 - - 89 - - -  02/13/17 2315 - 98 F (36.7 C) Oral - - - -  02/13/17 2311 - - - - - 96 % -  02/13/17 2310 - - - - - 96 % -  02/13/17 2302 (!) 175/102 - - 88 - - -    Intake/Output Summary (Last 24 hours) at 02/14/2017 2259 Last data filed at 02/14/2017  2200 Gross per 24 hour  Intake 3080.67 ml  Output 2130 ml  Net 950.67 ml   UOP: 400 in last hr, in prior 2 hrs  A&ox3 nml respirations rrr ctab LE: dtr +2, 3+ le edema, nt bilat  Fht: 130s, nml variability, +accels, no decels Toco: no ctx  A/p: severe pre-e with renal insufficiency; contin current plan; repeat labs at midnight; contin labetalol but follow for  worsening bp; improved UOP and will contin to follow closely; no signs/sx magnesium toxicity Severe anxiety: discussed with pt having husband communicate with family/friend and that person can relay info to others; also other ways to help stay calm and decrease anxiety Fetal status reassuring; nst q shift

## 2017-02-14 NOTE — Progress Notes (Signed)
S: Pt notes no HA since being in MAU, able to sleep. Anxiety slightly better. Pt still notes nausea as she's had all pregnancy. Able to tolerated clears, applesauce and protein bar today. BM today. No VB, no LOF, good FM, no ctx. No CP/SOB/Cough. No scotomata. No RUQ pain.  PE:  Vitals:   02/14/17 1017 02/14/17 1103 02/14/17 1129 02/14/17 1141  BP: (!) 157/91   (!) 146/82  Pulse: 82   80  Resp: 19 18  18   Temp: 98.1 F (36.7 C)     TempSrc: Axillary     SpO2:      Weight:   116.3 kg (256 lb 6.4 oz)   Height:        Gen: well appearing, anxious CV: RRR Pulm: CTAB, no crackles Abd: no RUQ pain, no fundal tenderness, gravid LE: 1+ DTR, no clonus, 3+ edema Toco: none Fh: 130s, + 10 x10 accels, no decels, 5-10 beat variability GU: deferred  Prelim u/s: 1'6, 43%, BPP 8/8, nl dopplers, post placenta, no previa   Repeat labs CBC Latest Ref Rng & Units 02/14/2017 02/13/2017  WBC 4.0 - 10.5 K/uL 16.0(H) 15.3(H)  Hemoglobin 12.0 - 15.0 g/dL 40.913.3 81.113.3  Hematocrit 91.436.0 - 46.0 % 39.1 38.6  Platelets 150 - 400 K/uL 169 166   CMP Latest Ref Rng & Units 02/14/2017 02/13/2017  Glucose 65 - 99 mg/dL 782(N137(H) 99  BUN 6 - 20 mg/dL 12 11  Creatinine 5.620.44 - 1.00 mg/dL 1.300.88 8.650.84  Sodium 784135 - 145 mmol/L 133(L) 133(L)  Potassium 3.5 - 5.1 mmol/L 4.4 4.5  Chloride 101 - 111 mmol/L 109 110  CO2 22 - 32 mmol/L 16(L) 15(L)  Calcium 8.9 - 10.3 mg/dL 7.8(L) 8.5(L)  Total Protein 6.5 - 8.1 g/dL 6.4(L) 5.9(L)  Total Bilirubin 0.3 - 1.2 mg/dL 0.4 0.8  Alkaline Phos 38 - 126 U/L 80 81  AST 15 - 41 U/L 39 55(H)  ALT 14 - 54 U/L 60(H) 64(H)   A/P: 41 you obese G1 at 24'3 with PEC, with severe features of HA, htn and doubling of AST/ ALT. HA improved, still with nasuea, treating with Zofran prn and scheduled Protonix. Given improvement in bp after IV meds (labetalol 20,40,80 mg the 10mg  IV hydralizine) and now on oral labetalol 400mg  q 8hrs and magnesium. If bp spikes again would repeat hydralizine 10mg  IV.  Once Mag stops, 48 hrs from start of Mag, can plan procardia.  WIll cont expectant management at this time.  Repeat labs 12 hrs from last, due at 2p, if stable will consider transfer to 3rd floor. 24 hr urine collection in progress though expect to be high due to Pr/Cr ration 4.1. Formal u/s report pending but prelim encouraging and stable NST.   Lendon ColonelKelly A Monetta Lick 02/14/2017 12:24 PM

## 2017-02-14 NOTE — Progress Notes (Signed)
Interval note  Subjective: Patient feels well after her shower. Patient notes no headache. No scotomata, no right upper quadrant pain. Patient has been drinking increased liquids. Good fetal movement, no vaginal bleeding  Objective: Vitals:   02/14/17 1129 02/14/17 1141 02/14/17 1411 02/14/17 1459  BP:  (!) 146/82 (!) 158/93 (!) 152/88  Pulse:  80 85 87  Resp:  18 18 18   Temp:      TempSrc:      SpO2:      Weight: 116.3 kg (256 lb 6.4 oz)     Height:       Gen.: Well-appearing no distress  CBC Latest Ref Rng & Units 02/14/2017 02/14/2017 02/13/2017  WBC 4.0 - 10.5 K/uL 17.8(H) 16.0(H) 15.3(H)  Hemoglobin 12.0 - 15.0 g/dL 44.012.7 10.213.3 72.513.3  Hematocrit 36.0 - 46.0 % 37.8 39.1 38.6  Platelets 150 - 400 K/uL 188 169 166    CMP Latest Ref Rng & Units 02/14/2017 02/14/2017 02/13/2017  Glucose 65 - 99 mg/dL 366(Y166(H) 403(K137(H) 99  BUN 6 - 20 mg/dL 16 12 11   Creatinine 0.44 - 1.00 mg/dL 7.42(V1.15(H) 9.560.88 3.870.84  Sodium 135 - 145 mmol/L 131(L) 133(L) 133(L)  Potassium 3.5 - 5.1 mmol/L 4.4 4.4 4.5  Chloride 101 - 111 mmol/L 105 109 110  CO2 22 - 32 mmol/L 17(L) 16(L) 15(L)  Calcium 8.9 - 10.3 mg/dL 7.7(L) 7.8(L) 8.5(L)  Total Protein 6.5 - 8.1 g/dL 5.6(E5.8(L) 6.4(L) 5.9(L)  Total Bilirubin 0.3 - 1.2 mg/dL 0.3 0.4 0.8  Alkaline Phos 38 - 126 U/L 77 80 81  AST 15 - 41 U/L 33 39 55(H)  ALT 14 - 54 U/L 53 60(H) 64(H)    Assessment and plan: 42 year old G1 at 24 weeks and 3 gestation with severe preeclampsia. Blood pressures under good control on labetalol 400 mg every 8 hours. No current symptoms of preeclampsia. Liver function tests and platelets improving. Patient with significant worsening in her creatinine. Urine output has remained at about 50 cc an hour. Given the improvement in symptoms blood pressure and all labs and the creatinine I a.m. exploring the possibility that the worsening of the creatinine is due to poor oral and IV intake prior to her last blood work. Patient was nothing by mouth through the night and  much of the morning and has also not had much oral intake due to her nausea.we have increased her IV fluids to 1 25 cc/h and patient has been drinking more water this afternoon. Will repeat labs at 6 PM and assess for trend. As long as overall maternal and fetal status remains reactive blood pressures remain well controlled and labs stabilize will continue expectant management. Would not deliver based on creatinine alone until patient is steroid complete. Case is discussed with Dr. Sallee LangeNitchie of maternal fetal medicine and we will request a formal consult with Dr. Sherrie Georgeecker in the morning.  Natasha Caldwell 02/14/2017 4:45 PM

## 2017-02-14 NOTE — Progress Notes (Signed)
Pt denies any h/a, vision changes, ruq pain; n/v improved and has tolerated po, has been drinking water; has been voiding more frequently and larger volume; no sob/cp Feeling less anxious compared to admission  Patient Vitals for the past 24 hrs:  BP Temp Temp src Pulse Resp SpO2 Weight  02/14/17 1741 (!) 143/82 98.5 F (36.9 C) Oral 80 20 - -  02/14/17 1731 - - - - 18 - -  02/14/17 1459 (!) 152/88 - - 87 18 - -  02/14/17 1411 (!) 158/93 - - 85 18 - -  02/14/17 1141 (!) 146/82 - - 80 18 - -  02/14/17 1129 - - - - - - 256 lb 6.4 oz (116.3 kg)  02/14/17 1103 - - - - 18 - -  02/14/17 1017 (!) 157/91 98.1 F (36.7 C) Axillary 82 19 - -  02/14/17 1000 - - - - 18 - -  02/14/17 0950 - - - - 20 - -  02/14/17 0841 (!) 144/87 - - 77 18 - -  02/14/17 0744 (!) 157/91 - - 86 18 - -  02/14/17 0741 - - - - - 97 % -  02/14/17 0736 - - - - - 98 % -  02/14/17 0731 - - - - - 98 % -  02/14/17 0726 - - - - - 97 % -  02/14/17 0725 (!) 179/104 - - 87 - - -  02/14/17 0723 (!) 179/104 98.1 F (36.7 C) Oral 85 18 95 % -  02/14/17 0645 - - - - - 98 % -  02/14/17 0630 - - - - - 97 % -  02/14/17 0615 - - - - - 97 % -  02/14/17 0600 - - - - - 97 % -  02/14/17 0545 - - - - - 97 % -  02/14/17 0530 - - - - - 96 % -  02/14/17 0521 (!) 159/92 - - 87 16 96 % -  02/14/17 0515 - - - - - 96 % -  02/14/17 0409 (!) 151/92 - - 85 16 - -  02/14/17 0304 (!) 154/91 - - 87 16 - -  02/14/17 0200 (!) 161/93 98.4 F (36.9 C) Oral 90 16 - -  02/14/17 0100 (!) 165/98 - - 88 16 - -  02/14/17 0002 (!) 162/92 - - 88 16 - -  02/14/17 0001 - - - - - 98 % -  02/13/17 2356 - - - - - 98 % -  02/13/17 2352 (!) 171/99 - - 90 16 - -  02/13/17 2342 (!) 171/99 - - 85 - - -  02/13/17 2322 (!) 176/102 - - 89 - - -  02/13/17 2315 - 98 F (36.7 C) Oral - - - -  02/13/17 2311 - - - - - 96 % -  02/13/17 2310 - - - - - 96 % -  02/13/17 2302 (!) 175/102 - - 88 - - -  02/13/17 2242 (!) 178/103 - - 85 - - -  02/13/17 2222 (!) 184/105 - -  92 - - -  02/13/17 2202 (!) 166/90 - - 86 - - -  02/13/17 2142 (!) 174/96 - - 87 16 - -   A&ox3 rrr ctab Abd: soft, nt, gravid LE: 2-3+ edema, nt bilat LE  FHT: 130, nml variability, _accels, 2 brief variables Toco: no ctx  CBC Latest Ref Rng & Units 02/14/2017 02/14/2017 02/14/2017  WBC 4.0 - 10.5 K/uL 19.6(H) 17.8(H) 16.0(H)  Hemoglobin  12.0 - 15.0 g/dL 16.112.6 09.612.7 04.513.3  Hematocrit 36.0 - 46.0 % 37.2 37.8 39.1  Platelets 150 - 400 K/uL 186 188 169   CMP Latest Ref Rng & Units 02/14/2017 02/14/2017 02/14/2017  Glucose 65 - 99 mg/dL 409(W151(H) 119(J166(H) 478(G137(H)  BUN 6 - 20 mg/dL 18 16 12   Creatinine 0.44 - 1.00 mg/dL 9.56(O1.17(H) 1.30(Q1.15(H) 6.570.88  Sodium 135 - 145 mmol/L 130(L) 131(L) 133(L)  Potassium 3.5 - 5.1 mmol/L 4.2 4.4 4.4  Chloride 101 - 111 mmol/L 100(L) 105 109  CO2 22 - 32 mmol/L 17(L) 17(L) 16(L)  Calcium 8.9 - 10.3 mg/dL 8.4(O6.8(L) 7.7(L) 7.8(L)  Total Protein 6.5 - 8.1 g/dL 6.3(L) 5.8(L) 6.4(L)  Total Bilirubin 0.3 - 1.2 mg/dL 9.6(E0.2(L) 0.3 0.4  Alkaline Phos 38 - 126 U/L 73 77 80  AST 15 - 41 U/L 33 33 39  ALT 14 - 54 U/L 51 53 60(H)    Intake/Output Summary (Last 24 hours) at 02/14/2017 2209 Last data filed at 02/14/2017 2100 Gross per 24 hour  Intake 2835.67 ml  Output 2130 ml  Net 705.67 ml     A/P: iup at 24.3 wga 1. Severe pre-e: lfts stable, alt previously 60 now 51; plts stable at 186 and h/h stable; pt is asymptomatic; contin magsulfate 2. Renal insufficiency: creatinine 1.7 now, up slightly from 1.5; UOP now about 100ml /hr in last 6 hrs (previously 1750ml/hr); pt has increased hydration and slight increase in her ivf -- follow closely; plan repeat labs at midnight (will continue to check q 6 hrs); MFM (Dr. Otho PerlNitsche) aware of pt and earlier recommendation to continue to follow - would reassess if creatinine continues to increase to 1.2 or oliguria 3. Blood pressures - currently labetalol 400mg  tid; pressures have improved greatly since overnight; more recently 140s-150s/80s-90s - will hold  current dose but will follow closely; low threshold to increase to 600mg  tid 4. Fetal status reassuring 5. Obese 6. gdma1 7. ama 8. Hypothyroid (hashimoto's) 9. IVF pregnancy 10. Asthma 11. Prematurity: bmz 1 at 1933 (2/2), bmz 2 due now; pt now agrees to proceed with NICU consult, however doesn't want consult now, wants to wait until am; spoke with neonatologist (Dr. Cleatis PolkaAuten) about pt and plan consult in am; pt does understand that status could change requiring delivery sooner 12. Anxiety - xanax prn, pt states will need xanax with mfm/nicu consult in am  Plan to continue obs per MFM unless worsening maternal or fetal status.  Formal consult pending in am.  Patient and husband aware of the above status.  Understand that while stable with labs, creatine is elevated and need to follow closely.  Plan to contin obs until at least 24 hrs after second dose bmz.

## 2017-02-14 NOTE — Progress Notes (Signed)
  Nurse reports pt sleeping between bp/ Mag checks  Vitals:   02/14/17 0100 02/14/17 0200 02/14/17 0304 02/14/17 0409  BP: (!) 165/98 (!) 161/93 (!) 154/91 (!) 151/92  Pulse: 88 90 87 85  Resp: 16 16 16 16   Temp:  98.4 F (36.9 C)    TempSrc:  Oral    SpO2:      Weight:      Height:       Repeat labs CBC Latest Ref Rng & Units 02/14/2017 02/13/2017  WBC 4.0 - 10.5 K/uL 16.0(H) 15.3(H)  Hemoglobin 12.0 - 15.0 g/dL 16.113.3 09.613.3  Hematocrit 04.536.0 - 46.0 % 39.1 38.6  Platelets 150 - 400 K/uL 169 166   CMP Latest Ref Rng & Units 02/14/2017 02/13/2017  Glucose 65 - 99 mg/dL 409(W137(H) 99  BUN 6 - 20 mg/dL 12 11  Creatinine 1.190.44 - 1.00 mg/dL 1.470.88 8.290.84  Sodium 562135 - 145 mmol/L 133(L) 133(L)  Potassium 3.5 - 5.1 mmol/L 4.4 4.5  Chloride 101 - 111 mmol/L 109 110  CO2 22 - 32 mmol/L 16(L) 15(L)  Calcium 8.9 - 10.3 mg/dL 7.8(L) 8.5(L)  Total Protein 6.5 - 8.1 g/dL 6.4(L) 5.9(L)  Total Bilirubin 0.3 - 1.2 mg/dL 0.4 0.8  Alkaline Phos 38 - 126 U/L 80 81  AST 15 - 41 U/L 39 55(H)  ALT 14 - 54 U/L 60(H) 64(H)   A/P: 41 you obese G1 at 24'3 with PEC, with severe features of HA, htn and doubling of AST/ ALT. HA improved, still with nasuea, treating with Zofran prn and scheduled Protonix. Given improvement in bp after IV meds (labetalol 20,40,80 mg the 10mg  IV hydralizine) and now on oral labetalol 400mg  q 8hrs and magnesium. If bp spikes again would repeat hydralizine 10mg  IV. WIll cont expectant management at this time.  Repeat labs 12 hrs from last, 24 hr urine collection in progress though expect to be high due to Pr/Cr ration 4.1. Formal u/s report pending but stable NST.   Natasha Caldwell 02/14/2017 5:35 AM

## 2017-02-15 ENCOUNTER — Inpatient Hospital Stay (HOSPITAL_COMMUNITY): Payer: 59

## 2017-02-15 LAB — COMPREHENSIVE METABOLIC PANEL
ALBUMIN: 2.7 g/dL — AB (ref 3.5–5.0)
ALBUMIN: 3 g/dL — AB (ref 3.5–5.0)
ALK PHOS: 69 U/L (ref 38–126)
ALK PHOS: 70 U/L (ref 38–126)
ALT: 47 U/L (ref 14–54)
ALT: 52 U/L (ref 14–54)
ALT: 48 U/L (ref 14–54)
ANION GAP: 7 (ref 5–15)
AST: 29 U/L (ref 15–41)
AST: 29 U/L (ref 15–41)
AST: 39 U/L (ref 15–41)
Albumin: 2.8 g/dL — ABNORMAL LOW (ref 3.5–5.0)
Alkaline Phosphatase: 71 U/L (ref 38–126)
Anion gap: 10 (ref 5–15)
Anion gap: 9 (ref 5–15)
BILIRUBIN TOTAL: 0.2 mg/dL — AB (ref 0.3–1.2)
BUN: 15 mg/dL (ref 6–20)
BUN: 16 mg/dL (ref 6–20)
BUN: 17 mg/dL (ref 6–20)
CALCIUM: 6.5 mg/dL — AB (ref 8.9–10.3)
CALCIUM: 6.6 mg/dL — AB (ref 8.9–10.3)
CHLORIDE: 107 mmol/L (ref 101–111)
CO2: 17 mmol/L — ABNORMAL LOW (ref 22–32)
CO2: 18 mmol/L — ABNORMAL LOW (ref 22–32)
CO2: 19 mmol/L — AB (ref 22–32)
CREATININE: 1.1 mg/dL — AB (ref 0.44–1.00)
CREATININE: 1.1 mg/dL — AB (ref 0.44–1.00)
Calcium: 6.5 mg/dL — ABNORMAL LOW (ref 8.9–10.3)
Chloride: 102 mmol/L (ref 101–111)
Chloride: 104 mmol/L (ref 101–111)
Creatinine, Ser: 0.95 mg/dL (ref 0.44–1.00)
GFR calc Af Amer: >60 mL/min (ref >60)
GFR calc non Af Amer: >60 mL/min (ref >60)
GFR calc non Af Amer: >60 mL/min (ref >60)
GLUCOSE: 148 mg/dL — AB (ref 65–99)
GLUCOSE: 152 mg/dL — AB (ref 65–99)
Glucose, Bld: 153 mg/dL — ABNORMAL HIGH (ref 65–99)
POTASSIUM: 4.5 mmol/L (ref 3.5–5.1)
POTASSIUM: 4.7 mmol/L (ref 3.5–5.1)
Potassium: 4.5 mmol/L (ref 3.5–5.1)
SODIUM: 132 mmol/L — AB (ref 135–145)
SODIUM: 132 mmol/L — AB (ref 135–145)
Sodium: 129 mmol/L — ABNORMAL LOW (ref 135–145)
TOTAL PROTEIN: 5.9 g/dL — AB (ref 6.5–8.1)
Total Bilirubin: 0.3 mg/dL (ref 0.3–1.2)
Total Bilirubin: 0.1 mg/dL — ABNORMAL LOW (ref 0.3–1.2)
Total Protein: 6 g/dL — ABNORMAL LOW (ref 6.5–8.1)
Total Protein: 6.2 g/dL — ABNORMAL LOW (ref 6.5–8.1)

## 2017-02-15 LAB — CBC
HCT: 36.4 % (ref 36.0–46.0)
HEMATOCRIT: 36.5 % (ref 36.0–46.0)
HEMOGLOBIN: 12.1 g/dL (ref 12.0–15.0)
Hemoglobin: 12.4 g/dL (ref 12.0–15.0)
MCH: 28.2 pg (ref 26.0–34.0)
MCH: 28 pg (ref 26.0–34.0)
MCHC: 34 g/dL (ref 30.0–36.0)
MCHC: 33.2 g/dL (ref 30.0–36.0)
MCV: 83 fL (ref 78.0–100.0)
MCV: 84.3 fL (ref 78.0–100.0)
Platelets: 196 10*3/uL (ref 150–400)
Platelets: 210 10*3/uL (ref 150–400)
RBC: 4.4 MIL/uL (ref 3.87–5.11)
RBC: 4.32 MIL/uL (ref 3.87–5.11)
RDW: 14.6 % (ref 11.5–15.5)
RDW: 14.6 % (ref 11.5–15.5)
WBC: 18.6 10*3/uL — ABNORMAL HIGH (ref 4.0–10.5)
WBC: 18.4 10*3/uL — ABNORMAL HIGH (ref 4.0–10.5)

## 2017-02-15 LAB — PROTEIN, URINE, 24 HOUR
COLLECTION INTERVAL-UPROT: 24 h
PROTEIN, 24H URINE: 1926 mg/d — AB (ref 50–100)
PROTEIN, URINE: 72 mg/dL
Urine Total Volume-UPROT: 2675 mL

## 2017-02-15 LAB — CREATININE CLEARANCE, URINE, 24 HOUR
COLLECTION INTERVAL-CRCL: 24 h
CREATININE, URINE: 75.78 mg/dL
Creatinine Clearance: 128 mL/min — ABNORMAL HIGH (ref 75–115)
Creatinine, 24H Ur: 2027 mg/d — ABNORMAL HIGH (ref 600–1800)
URINE TOTAL VOLUME-CRCL: 2675 mL

## 2017-02-15 LAB — GLUCOSE, CAPILLARY: Glucose-Capillary: 139 mg/dL — ABNORMAL HIGH (ref 65–99)

## 2017-02-15 MED ORDER — LORATADINE 10 MG PO TABS
10.0000 mg | ORAL_TABLET | Freq: Every day | ORAL | Status: DC
  Filled 2017-02-15 (×4): qty 1

## 2017-02-15 MED ORDER — LEVOTHYROXINE SODIUM 100 MCG IV SOLR: 100.0000 ug | Freq: Every day | INTRAVENOUS | Status: DC

## 2017-02-15 MED ORDER — THYROID 260 MG PO TABS
130.0000 mg | ORAL_TABLET | Freq: Every morning | ORAL | Status: DC
  Administered 2017-02-15 – 2017-02-17 (×3): 130 mg via ORAL
  Filled 2017-02-15: qty 1

## 2017-02-15 MED ORDER — FLUTICASONE PROPIONATE 50 MCG/ACT NA SUSP
2.0000 | Freq: Every day | NASAL | Status: DC
  Filled 2017-02-15: qty 16

## 2017-02-15 MED ORDER — THYROID 120 MG PO TABS
130.0000 mg | ORAL_TABLET | Freq: Every day | ORAL | Status: DC
  Filled 2017-02-15: qty 1

## 2017-02-15 MED ORDER — LEVOTHYROXINE SODIUM 100 MCG PO TABS
100.0000 ug | ORAL_TABLET | Freq: Every day | ORAL | Status: DC
  Administered 2017-02-15 – 2017-02-17 (×3): 100 ug via ORAL
  Filled 2017-02-15: qty 1

## 2017-02-15 NOTE — Progress Notes (Signed)
Pt w/o c/o; no n/v, no h/a, vision changes, ruq pain, tol po Working on calming down though feeling better since creatinine has improved  Bps range 140s-150s 151/84, 79, 18  Intake/Output Summary (Last 24 hours) at 02/15/2017 1039 Last data filed at 02/15/2017 0600 Gross per 24 hour  Intake 3075.5 ml  Output 3100 ml  Net -24.5 ml     A&ox3 rrr ctab Abd: soft, nt, gravid LE: 3+ edema, nt bilat; scds in place  Fht: 130s, normal variability, no decels Toco: no ctx  Results for orders placed or performed during the hospital encounter of 02/13/17 (from the past 24 hour(s))  CBC     Status: Abnormal   Collection Time: 02/14/17 12:14 PM  Result Value Ref Range   WBC 17.8 (H) 4.0 - 10.5 K/uL   RBC 4.56 3.87 - 5.11 MIL/uL   Hemoglobin 12.7 12.0 - 15.0 g/dL   HCT 60.4 54.0 - 98.1 %   MCV 82.9 78.0 - 100.0 fL   MCH 27.9 26.0 - 34.0 pg   MCHC 33.6 30.0 - 36.0 g/dL   RDW 19.1 47.8 - 29.5 %   Platelets 188 150 - 400 K/uL  Comprehensive metabolic panel     Status: Abnormal   Collection Time: 02/14/17 12:14 PM  Result Value Ref Range   Sodium 131 (L) 135 - 145 mmol/L   Potassium 4.4 3.5 - 5.1 mmol/L   Chloride 105 101 - 111 mmol/L   CO2 17 (L) 22 - 32 mmol/L   Glucose, Bld 166 (H) 65 - 99 mg/dL   BUN 16 6 - 20 mg/dL   Creatinine, Ser 6.21 (H) 0.44 - 1.00 mg/dL   Calcium 7.7 (L) 8.9 - 10.3 mg/dL   Total Protein 5.8 (L) 6.5 - 8.1 g/dL   Albumin 2.8 (L) 3.5 - 5.0 g/dL   AST 33 15 - 41 U/L   ALT 53 14 - 54 U/L   Alkaline Phosphatase 77 38 - 126 U/L   Total Bilirubin 0.3 0.3 - 1.2 mg/dL   GFR calc non Af Amer 58 (L) >60 mL/min   GFR calc Af Amer >60 >60 mL/min   Anion gap 9 5 - 15  Uric acid     Status: None   Collection Time: 02/14/17 12:14 PM  Result Value Ref Range   Uric Acid, Serum 5.0 2.3 - 6.6 mg/dL  CBC     Status: Abnormal   Collection Time: 02/14/17  6:09 PM  Result Value Ref Range   WBC 19.6 (H) 4.0 - 10.5 K/uL   RBC 4.46 3.87 - 5.11 MIL/uL   Hemoglobin 12.6 12.0 -  15.0 g/dL   HCT 30.8 65.7 - 84.6 %   MCV 83.4 78.0 - 100.0 fL   MCH 28.3 26.0 - 34.0 pg   MCHC 33.9 30.0 - 36.0 g/dL   RDW 96.2 95.2 - 84.1 %   Platelets 186 150 - 400 K/uL  Comprehensive metabolic panel     Status: Abnormal   Collection Time: 02/14/17  6:09 PM  Result Value Ref Range   Sodium 130 (L) 135 - 145 mmol/L   Potassium 4.2 3.5 - 5.1 mmol/L   Chloride 100 (L) 101 - 111 mmol/L   CO2 17 (L) 22 - 32 mmol/L   Glucose, Bld 151 (H) 65 - 99 mg/dL   BUN 18 6 - 20 mg/dL   Creatinine, Ser 3.24 (H) 0.44 - 1.00 mg/dL   Calcium 6.8 (L) 8.9 - 10.3 mg/dL   Total  Protein 6.3 (L) 6.5 - 8.1 g/dL   Albumin 2.9 (L) 3.5 - 5.0 g/dL   AST 33 15 - 41 U/L   ALT 51 14 - 54 U/L   Alkaline Phosphatase 73 38 - 126 U/L   Total Bilirubin 0.2 (L) 0.3 - 1.2 mg/dL   GFR calc non Af Amer 57 (L) >60 mL/min   GFR calc Af Amer >60 >60 mL/min   Anion gap 13 5 - 15  CBC     Status: Abnormal   Collection Time: 02/14/17 11:35 PM  Result Value Ref Range   WBC 19.3 (H) 4.0 - 10.5 K/uL   RBC 4.36 3.87 - 5.11 MIL/uL   Hemoglobin 12.3 12.0 - 15.0 g/dL   HCT 16.136.6 09.636.0 - 04.546.0 %   MCV 83.9 78.0 - 100.0 fL   MCH 28.2 26.0 - 34.0 pg   MCHC 33.6 30.0 - 36.0 g/dL   RDW 40.914.5 81.111.5 - 91.415.5 %   Platelets 195 150 - 400 K/uL  Comprehensive metabolic panel     Status: Abnormal   Collection Time: 02/14/17 11:35 PM  Result Value Ref Range   Sodium 129 (L) 135 - 145 mmol/L   Potassium 4.7 3.5 - 5.1 mmol/L   Chloride 102 101 - 111 mmol/L   CO2 17 (L) 22 - 32 mmol/L   Glucose, Bld 153 (H) 65 - 99 mg/dL   BUN 17 6 - 20 mg/dL   Creatinine, Ser 7.821.10 (H) 0.44 - 1.00 mg/dL   Calcium 6.5 (L) 8.9 - 10.3 mg/dL   Total Protein 5.9 (L) 6.5 - 8.1 g/dL   Albumin 2.8 (L) 3.5 - 5.0 g/dL   AST 29 15 - 41 U/L   ALT 48 14 - 54 U/L   Alkaline Phosphatase 71 38 - 126 U/L   Total Bilirubin 0.2 (L) 0.3 - 1.2 mg/dL   GFR calc non Af Amer >60 >60 mL/min   GFR calc Af Amer >60 >60 mL/min   Anion gap 10 5 - 15  CBC     Status: Abnormal    Collection Time: 02/15/17  5:41 AM  Result Value Ref Range   WBC 18.6 (H) 4.0 - 10.5 K/uL   RBC 4.40 3.87 - 5.11 MIL/uL   Hemoglobin 12.4 12.0 - 15.0 g/dL   HCT 95.636.5 21.336.0 - 08.646.0 %   MCV 83.0 78.0 - 100.0 fL   MCH 28.2 26.0 - 34.0 pg   MCHC 34.0 30.0 - 36.0 g/dL   RDW 57.814.6 46.911.5 - 62.915.5 %   Platelets 196 150 - 400 K/uL  Comprehensive metabolic panel     Status: Abnormal   Collection Time: 02/15/17  5:41 AM  Result Value Ref Range   Sodium 132 (L) 135 - 145 mmol/L   Potassium 4.5 3.5 - 5.1 mmol/L   Chloride 107 101 - 111 mmol/L   CO2 18 (L) 22 - 32 mmol/L   Glucose, Bld 148 (H) 65 - 99 mg/dL   BUN 15 6 - 20 mg/dL   Creatinine, Ser 5.280.95 0.44 - 1.00 mg/dL   Calcium 6.5 (L) 8.9 - 10.3 mg/dL   Total Protein 6.0 (L) 6.5 - 8.1 g/dL   Albumin 2.7 (L) 3.5 - 5.0 g/dL   AST 29 15 - 41 U/L   ALT 47 14 - 54 U/L   Alkaline Phosphatase 69 38 - 126 U/L   Total Bilirubin 0.3 0.3 - 1.2 mg/dL   GFR calc non Af Amer >60 >60 mL/min   GFR  calc Af Amer >60 >60 mL/min   Anion gap 7 5 - 15    A/p: iup at 24.4 wga 1. Severe pre-e: stable with improved creatinine, lfts/cbc stable; asymptomatic; bps stable in mild range; discussed again with pt and husband plan for NICU consutl and MFM consult with Dr. Sherrie George today.  Reviewed lab results from this am as improved/stable and pt encouraged.  She is still very anxious when discussing and requesting xanax for consults.  Patient understands that she will be in hospital for obs until delivery and that we will be monitoring her and baby closely for any signs of worsening status; contin magnesium sulfate, no signs/symptoms toxicity (will be 48 hrs around 2100); follow bps closely, currently stable with current labetalol dose 2. Prematurity- nicu consult pending; contin nst q shift; reassuring for gestational age; second bmz dose 2030 2/3 3. Anxiety - encourage calming behaviors, ok for xanax this am 4. Hypothyroid - resume home medication  5. Obese 6. gdma 1 7.  ama 8. Asthma - stable 9. ivf pregnancy 7.

## 2017-02-15 NOTE — Anesthesia Pain Management Evaluation Note (Signed)
  [facility] Pain Management Visit Note  Patient: Natasha LynchCheryl Booz, 42 y.o., female  "Hello I am a member of the anesthesia team at [facility]. We have an anesthesia team available at all times to provide care throughout the hospital, including epidural management and anesthesia for C-section. I don't know your plan for the delivery whether it a natural birth, water birth, IV sedation, nitrous supplementation, doula or epidural, but we want to meet your pain goals."   1.Was your pain managed to your expectations on prior hospitalizations?   No prior hospitalizations  2.What is your expectation for pain management during this hospitalization?     Epidural  3.How can we help you reach that goal? Be aware of multiple allergies and family history of anesthesia reactions. One close relative has a history of hives with a local anesthetic which the patient is getting more information on.  Record the patient's initial score and the patient's pain goal.   Pain: 0  Pain Goal: 2 [facility] wants you to be able to say your pain was always managed very well.  Maverik Foot 02/15/2017

## 2017-02-15 NOTE — Progress Notes (Signed)
Per nursing: no h/a, vision changes, ruq pain; slept through the night;  Pt w/o c/o  BP (!) 155/92   Pulse 81   Temp 98.9 F (37.2 C) (Oral)   Resp 20   Ht 5\' 5"  (1.651 m)   Wt 256 lb 6.4 oz (116.3 kg)   SpO2 96%   BMI 42.67 kg/m   bp range: 140s-150s/70s-90s   Intake/Output Summary (Last 24 hours) at 02/15/2017 0650 Last data filed at 02/15/2017 0600 Gross per 24 hour  Intake 3460.5 ml  Output 3230 ml  Net 230.5 ml     CBC Latest Ref Rng & Units 02/15/2017 02/14/2017 02/14/2017  WBC 4.0 - 10.5 K/uL 18.6(H) 19.3(H) 19.6(H)  Hemoglobin 12.0 - 15.0 g/dL 16.112.4 09.612.3 04.512.6  Hematocrit 36.0 - 46.0 % 36.5 36.6 37.2  Platelets 150 - 400 K/uL 196 195 186   CMP Latest Ref Rng & Units 02/15/2017 02/14/2017 02/14/2017  Glucose 65 - 99 mg/dL 409(W148(H) 119(J153(H) 478(G151(H)  BUN 6 - 20 mg/dL 15 17 18   Creatinine 0.44 - 1.00 mg/dL 9.560.95 2.13(Y1.10(H) 8.65(H1.17(H)  Sodium 135 - 145 mmol/L 132(L) 129(L) 130(L)  Potassium 3.5 - 5.1 mmol/L 4.5 4.7 4.2  Chloride 101 - 111 mmol/L 107 102 100(L)  CO2 22 - 32 mmol/L 18(L) 17(L) 17(L)  Calcium 8.9 - 10.3 mg/dL 8.4(O6.5(L) 6.5(L) 6.8(L)  Total Protein 6.5 - 8.1 g/dL 6.0(L) 5.9(L) 6.3(L)  Total Bilirubin 0.3 - 1.2 mg/dL 0.3 9.6(E0.2(L) 9.5(M0.2(L)  Alkaline Phos 38 - 126 U/L 69 71 73  AST 15 - 41 U/L 29 29 33  ALT 14 - 54 U/L 47 48 51  24 hr protein: 1926mg /24 hr  A/p: iup at 24.4 wga Severe pre-e: lfts stable, labs stable and creatinine is improving; continued good UOP; pt is asymptomatic; contin mag sulfate, no s/sx toxicity 2. Renal insufficiency: improved, still follow closely;  MFM aware of pt and formal consult pending this am; no recommendations for delivery at this time though if worsening pt or maternal status would need to reassess for delivery; s/p bmz x2 11 hrs ago 3. Blood pressures - currently labetalol 400mg  tid; mild range - contin current labetalol dose, though follow closely for worsening bps 4. Fetal status reassuring - nst this am pending; u/s 1 day ago: 1'6, 43%, BPP 8/8, nl  dopplers, post placenta, no previa 5. Obese 6. gdma1 7. ama 8. Hypothyroid (hashimoto's) 9. IVF pregnancy 10. Asthma - stable, no c/o 11. Prematurity: NICU consult pending for this am 12. Anxiety - xanax prn, pt states will need xanax with mfm/nicu consult in am  Pt is now 11 hrs s/p bmz x2.  Continue observation and observe closely for worsening maternal or fetal status and would need to then reassess for delivery.  Pt currently asymptomatic and improved creatinine.

## 2017-02-15 NOTE — Consult Note (Signed)
Asked by Dr Amado NashAlmquist to speak to Natasha Caldwell to discuss outcome of preterm pregnancy at 24 weeks. Chart reviewed. She is 24 4/7 weeks, IVF,  with intact membranes.  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 She has received 2 doses of betamethasone. Currently on antihypertensives.   I spoke to Natasha Caldwell in her room and discussed general survival rate and common morbidities associated with this gestation such as RDS, GI immaturity, high risk of IVH, ROP, and poor immune response. Discussed need for temp support, gavage feeding, and LOS. I discussed decreasing morbidity and decreased LOS with increasing gest. I also discussed breast feeding and benefits especially to a preterm baby.   I answered her questions to their satisfaction.   I spent 30 minutes with this consult, more than 50% of the time was with face-to-face counseling with Natasha Natasha Caldwell    Natasha Warren Q [name], MD  Neonatologist

## 2017-02-15 NOTE — Progress Notes (Signed)
No c/o per nursing   Patient Vitals for the past 24 hrs:  BP Temp Temp src Pulse Resp SpO2 Weight  02/14/17 2331 - - - - - 96 % -  02/14/17 2330 (!) 159/88 - - 79 - 96 % -  02/14/17 2300 (!) 174/94 - - 82 - - -  02/14/17 2104 (!) 151/86 98 F (36.7 C) Oral 82 20 - -  02/14/17 1741 (!) 143/82 98.5 F (36.9 C) Oral 80 20 - -  02/14/17 1731 - - - - 18 - -  02/14/17 1459 (!) 152/88 - - 87 18 - -  02/14/17 1411 (!) 158/93 - - 85 18 - -  02/14/17 1141 (!) 146/82 - - 80 18 - -  02/14/17 1129 - - - - - - 256 lb 6.4 oz (116.3 kg)  02/14/17 1103 - - - - 18 - -  02/14/17 1017 (!) 157/91 98.1 F (36.7 C) Axillary 82 19 - -  02/14/17 1000 - - - - 18 - -  02/14/17 0950 - - - - 20 - -  02/14/17 0841 (!) 144/87 - - 77 18 - -  02/14/17 0744 (!) 157/91 - - 86 18 - -  02/14/17 0741 - - - - - 97 % -  02/14/17 0736 - - - - - 98 % -  02/14/17 0731 - - - - - 98 % -  02/14/17 0726 - - - - - 97 % -  02/14/17 0725 (!) 179/104 - - 87 - - -  02/14/17 0723 (!) 179/104 98.1 F (36.7 C) Oral 85 18 95 % -  02/14/17 0645 - - - - - 98 % -  02/14/17 0630 - - - - - 97 % -  02/14/17 0615 - - - - - 97 % -  02/14/17 0600 - - - - - 97 % -  02/14/17 0545 - - - - - 97 % -  02/14/17 0530 - - - - - 96 % -  02/14/17 0521 (!) 159/92 - - 87 16 96 % -  02/14/17 0515 - - - - - 96 % -  02/14/17 0409 (!) 151/92 - - 85 16 - -  02/14/17 0304 (!) 154/91 - - 87 16 - -  02/14/17 0200 (!) 161/93 98.4 F (36.9 C) Oral 90 16 - -  02/14/17 0100 (!) 165/98 - - 88 16 - -   One severe range bp - this was during anxiety episode   I/o:   Intake/Output Summary (Last 24 hours) at 02/15/2017 0047 Last data filed at 02/15/2017 0000 Gross per 24 hour  Intake 3230.67 ml  Output 2655 ml  Net 575.67 ml   UOP: 300ml in last hour   CBC Latest Ref Rng & Units 02/14/2017 02/14/2017 02/14/2017  WBC 4.0 - 10.5 K/uL 19.3(H) 19.6(H) 17.8(H)  Hemoglobin 12.0 - 15.0 g/dL 78.212.3 95.612.6 21.312.7  Hematocrit 36.0 - 46.0 % 36.6 37.2 37.8  Platelets 150 -  400 K/uL 195 186 188   CMP Latest Ref Rng & Units 02/14/2017 02/14/2017 02/14/2017  Glucose 65 - 99 mg/dL 086(V153(H) 784(O151(H) 962(X166(H)  BUN 6 - 20 mg/dL 17 18 16   Creatinine 0.44 - 1.00 mg/dL 5.28(U1.10(H) 1.32(G1.17(H) 4.01(U1.15(H)  Sodium 135 - 145 mmol/L 129(L) 130(L) 131(L)  Potassium 3.5 - 5.1 mmol/L 4.7 4.2 4.4  Chloride 101 - 111 mmol/L 102 100(L) 105  CO2 22 - 32 mmol/L 17(L) 17(L) 17(L)  Calcium 8.9 - 10.3 mg/dL 2.7(O6.5(L) 6.8(L) 7.7(L)  Total Protein 6.5 - 8.1 g/dL 5.9(L) 6.3(L) 5.8(L)  Total Bilirubin 0.3 - 1.2 mg/dL 0.4(V) 4.0(J) 0.3  Alkaline Phos 38 - 126 U/L 71 73 77  AST 15 - 41 U/L 29 33 33  ALT 14 - 54 U/L 48 51 53   A/P: iup at 24.4 wga 1. Severe pre-e: lfts stable, alt previously 60 now 51; plts stable at 186 and h/h stable; pt is asymptomatic; contin mag sulfate 2 G/hr 2. Renal insufficiency:improving, creatinine now 1.10 down from 1.17-- follow closely; plan repeat labs at 0600 ; formal MFM consult pending this am 3. Blood pressures - currently labetalol 400mg  tid; pressures have improved greatly since overnight; more recently 140s-150s/80s-90s - will hold current dose but will follow closely; low threshold to increase to 600mg  tid; one severe range bp during witnessed anxiety episode 4. Fetal status reassuring - nst q shift 5. Obese 6. gdma1 7. ama 8. Hypothyroid (hashimoto's) 9. IVF pregnancy 10. Asthma 11. Prematurity: bmz 1 at 1933 (2/2), bmz 2 at 2030 (2/3) 12. Anxiety - xanax prn, pt states will need xanax with mfm/nicu consult in am; pt to also decrease stimulation and avoid activities/phone, etc that cause anxiety

## 2017-02-15 NOTE — Progress Notes (Signed)
Inpatient Diabetes Program Recommendations  Diabetes Treatment Program Recommendations  ADA Standards of Care 2019 Diabetes in Pregnancy Target Glucose Ranges:  Fasting: 60 - 90 mg/dL Preprandial: 60 - 161105 mg/dL 1 hr postprandial: Less than 140mg /dL (from first bite of meal) 2 hr postprandial: Less than 120 mg/dL (from first bite of meal)    Results for [name], Natasha (MRN [id number]) as of 02/15/2017 13:36  Ref. Range 02/13/2017 19:15 02/14/2017 01:50 02/14/2017 12:14 02/14/2017 18:09 02/14/2017 23:35 02/15/2017 05:41 02/15/2017 12:15  Glucose Latest Ref Range: 65 - 99 mg/dL 99 811137 (H) 914166 (H) 782151 (H) 153 (H) 148 (H) 152 (H)   Review of Glycemic Control  Diabetes history: Per H&P, insulin resistance hx Outpatient Diabetes medications: None Current orders for Inpatient glycemic control: None  Inpatient Diabetes Program Recommendations:  Correction (SSI): Noted patient has received 2 doses of Betamethasone (on 02/13/17 and 02/14/17). While inpatient, please consider ordering CBGs and Novolog 0-14 units QID (fasting and 2 hour post prandial). Diet: Please consider changing diet from Regular to Carb Modified diet.  Thanks, Orlando PennerMarie Nickalos Petersen, RN, MSN, CDE Diabetes Coordinator Inpatient Diabetes Program [phone] (Team Pager from 8am to 5pm)

## 2017-02-15 NOTE — Consult Note (Signed)
MFM Note  Natasha Caldwell is a 42 year old G1 Caucasian fetal at 24+4 weeks who was admitted 2 days ago with the diagnosis of superimposed preeclampsia with severe features.  She presented to the MAU with a 1-2 day history of low back pain which changed to right upper back pain. Then, she began to experience nausea and vomiting which gradually worsen to being unable to tolerate clear liquids. Upon presentation, her BP was 210/117 and she had new complaints of a headache and "crampy" upper abdominal pain. She received IVFs, magnesium sulfate, BMZ and several doses of labetalol and one dose of hydralazine. With this management, her blood pressures improvement and settled into the mildly hypertensive range. Initial labs showed: plts - 166K, PC ratio - 4.19, AST/ALT - 55/64; H/H 13.3/38.6, Cr 0.84.  Ultrasound: singleton in transverse presentation; EFW at the 43rd %tile; normal AFV; very little fetal anatomy obtained but appeared normal; BPP 8/8 with normal UA dopplers; posterior placenta without previa  Prenatal course significant for:  * chronic hypertension - switched from carvedilol to labetalol, was on 200 mgs bid on admission * morbid obesity * advanced maternal age - opted for first trimester screen which returned in the low risk range * Hashimoto's thyroiditis with secondary hypothyroidism managed with levothyroxine * significant anxiety disorder - managed with therapy, exercise and prn anxiolytics * ? 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 Underlying renal disease (d/t cHTN) with initial Cr of 0.95 and 24 hr urine protein of 287 mgs 3) Anxiety disorder 4) AMA 5) Hypothyroidism 6) GDM A1 7) Morbid obesity 8) ART conception 9) H/O hysteroscopic myomectomy 10) Fetal status reassuring  - Agree with discontinuing magnesium sulfate after 48 hours - BP goal: 120-160/80-105; increase labetalol as necessary; at 800 tid could add Procardia - continue to increase time between blood draws; would check at least Wed and Fri this week - monitor fetal tracing one hour 3 x day - growth US in 2 weeks - delivery indications include: pulmonary edema, persistent CNS symptoms, uncontrolled severe BPs, nonreassuring fetal status, HELLP syndrome, worsening renal function, abruption, eclampsia - consider trying to arrange in house fetal ECHO due to ART/ICSI  Thank you for the referral. Please call again with any questions or concerns about management and/or delivery timing.  (Face-to-face consultation with patient: 45 minutes)

## 2017-02-16 ENCOUNTER — Other Ambulatory Visit (HOSPITAL_COMMUNITY): Payer: Self-pay | Admitting: *Deleted

## 2017-02-16 ENCOUNTER — Inpatient Hospital Stay (HOSPITAL_COMMUNITY): Payer: 59

## 2017-02-16 DIAGNOSIS — O09512 Supervision of elderly primigravida, second trimester: Secondary | ICD-10-CM

## 2017-02-16 DIAGNOSIS — O09522 Supervision of elderly multigravida, second trimester: Secondary | ICD-10-CM

## 2017-02-16 DIAGNOSIS — Z363 Encounter for antenatal screening for malformations: Secondary | ICD-10-CM

## 2017-02-16 DIAGNOSIS — Z3A24 24 weeks gestation of pregnancy: Secondary | ICD-10-CM

## 2017-02-16 DIAGNOSIS — O99213 Obesity complicating pregnancy, third trimester: Secondary | ICD-10-CM

## 2017-02-16 DIAGNOSIS — O09811 Supervision of pregnancy resulting from assisted reproductive technology, first trimester: Secondary | ICD-10-CM

## 2017-02-16 LAB — COMPREHENSIVE METABOLIC PANEL
ALBUMIN: 2.7 g/dL — AB (ref 3.5–5.0)
ALK PHOS: 62 U/L (ref 38–126)
ALT: 45 U/L (ref 14–54)
ALT: 51 U/L (ref 14–54)
AST: 29 U/L (ref 15–41)
AST: 38 U/L (ref 15–41)
Albumin: 2.9 g/dL — ABNORMAL LOW (ref 3.5–5.0)
Alkaline Phosphatase: 70 U/L (ref 38–126)
Anion gap: 8 (ref 5–15)
Anion gap: 9 (ref 5–15)
BILIRUBIN TOTAL: 0.4 mg/dL (ref 0.3–1.2)
BUN: 18 mg/dL (ref 6–20)
BUN: 17 mg/dL (ref 6–20)
CALCIUM: 6.5 mg/dL — AB (ref 8.9–10.3)
CHLORIDE: 107 mmol/L (ref 101–111)
CO2: 19 mmol/L — ABNORMAL LOW (ref 22–32)
CO2: 18 mmol/L — ABNORMAL LOW (ref 22–32)
CREATININE: 0.97 mg/dL (ref 0.44–1.00)
Calcium: 6.2 mg/dL — CL (ref 8.9–10.3)
Chloride: 107 mmol/L (ref 101–111)
Creatinine, Ser: 0.94 mg/dL (ref 0.44–1.00)
GFR calc Af Amer: >60 mL/min (ref >60)
GFR calc non Af Amer: >60 mL/min (ref >60)
GLUCOSE: 113 mg/dL — AB (ref 65–99)
Glucose, Bld: 102 mg/dL — ABNORMAL HIGH (ref 65–99)
POTASSIUM: 3.9 mmol/L (ref 3.5–5.1)
Potassium: 3.9 mmol/L (ref 3.5–5.1)
Sodium: 134 mmol/L — ABNORMAL LOW (ref 135–145)
Sodium: 134 mmol/L — ABNORMAL LOW (ref 135–145)
TOTAL PROTEIN: 5.3 g/dL — AB (ref 6.5–8.1)
TOTAL PROTEIN: 5.8 g/dL — AB (ref 6.5–8.1)
Total Bilirubin: 0.2 mg/dL — ABNORMAL LOW (ref 0.3–1.2)

## 2017-02-16 LAB — GLUCOSE, CAPILLARY
GLUCOSE-CAPILLARY: 98 mg/dL (ref 65–99)
GLUCOSE-CAPILLARY: 93 mg/dL (ref 65–99)
Glucose-Capillary: 101 mg/dL — ABNORMAL HIGH (ref 65–99)
Glucose-Capillary: 99 mg/dL (ref 65–99)
Glucose-Capillary: 96 mg/dL (ref 65–99)
Glucose-Capillary: 106 mg/dL — ABNORMAL HIGH (ref 65–99)
Glucose-Capillary: 118 mg/dL — ABNORMAL HIGH (ref 65–99)

## 2017-02-16 LAB — CBC
HCT: 36.8 % (ref 36.0–46.0)
HEMATOCRIT: 33.2 % — AB (ref 36.0–46.0)
HEMOGLOBIN: 11.3 g/dL — AB (ref 12.0–15.0)
Hemoglobin: 12.1 g/dL (ref 12.0–15.0)
MCH: 28.9 pg (ref 26.0–34.0)
MCH: 27.8 pg (ref 26.0–34.0)
MCHC: 34 g/dL (ref 30.0–36.0)
MCHC: 32.9 g/dL (ref 30.0–36.0)
MCV: 84.9 fL (ref 78.0–100.0)
MCV: 84.6 fL (ref 78.0–100.0)
PLATELETS: 213 10*3/uL (ref 150–400)
Platelets: 185 10*3/uL (ref 150–400)
RBC: 3.91 MIL/uL (ref 3.87–5.11)
RBC: 4.35 MIL/uL (ref 3.87–5.11)
RDW: 14.9 % (ref 11.5–15.5)
RDW: 14.9 % (ref 11.5–15.5)
WBC: 14.5 10*3/uL — AB (ref 4.0–10.5)
WBC: 16.8 10*3/uL — ABNORMAL HIGH (ref 4.0–10.5)

## 2017-02-16 LAB — PROTEIN / CREATININE RATIO, URINE
CREATININE, URINE: 12 mg/dL
Protein Creatinine Ratio: 2.67 mg/mg{Cre} — ABNORMAL HIGH (ref 0.00–0.15)
Total Protein, Urine: 32 mg/dL

## 2017-02-16 LAB — URIC ACID: Uric Acid, Serum: 5.3 mg/dL (ref 2.3–6.6)

## 2017-02-16 LAB — TYPE AND SCREEN
ABO/RH(D): O POS
Antibody Screen: NEGATIVE

## 2017-02-16 MED ORDER — NIFEDIPINE ER OSMOTIC RELEASE 30 MG PO TB24
30.0000 mg | ORAL_TABLET | Freq: Every day | ORAL | Status: DC
  Administered 2017-02-16 – 2017-02-20 (×5): 30 mg via ORAL
  Filled 2017-02-16 (×5): qty 1

## 2017-02-16 MED ORDER — HYDRALAZINE HCL 20 MG/ML IJ SOLN: 10.0000 mg | Freq: Once | INTRAMUSCULAR | Status: AC

## 2017-02-16 MED ORDER — SALINE SPRAY 0.65 % NA SOLN
1.0000 | NASAL | Status: DC | PRN
  Administered 2017-02-16: 1 via NASAL
  Filled 2017-02-16: qty 44

## 2017-02-16 MED ORDER — BUSPIRONE HCL 10 MG PO TABS
10.0000 mg | ORAL_TABLET | Freq: Two times a day (BID) | ORAL | Status: DC
  Administered 2017-02-16 – 2017-02-21 (×11): 10 mg via ORAL
  Filled 2017-02-16 (×13): qty 1

## 2017-02-16 MED ORDER — LABETALOL HCL 200 MG PO TABS
600.0000 mg | ORAL_TABLET | Freq: Three times a day (TID) | ORAL | Status: DC
Start: 2017-02-16 — End: 2017-02-17
  Administered 2017-02-16 – 2017-02-17 (×3): 600 mg via ORAL
  Filled 2017-02-16 (×3): qty 3

## 2017-02-16 MED ORDER — HYDRALAZINE HCL 20 MG/ML IJ SOLN
5.0000 mg | INTRAMUSCULAR | Status: AC
  Administered 2017-02-16: 5 mg via INTRAVENOUS
  Filled 2017-02-16: qty 1

## 2017-02-16 MED ORDER — HYDRALAZINE HCL 20 MG/ML IJ SOLN
10.0000 mg | INTRAMUSCULAR | Status: AC
  Administered 2017-02-16: 10 mg via INTRAVENOUS
  Filled 2017-02-16: qty 1

## 2017-02-16 MED ORDER — HYDRALAZINE HCL 20 MG/ML IJ SOLN
5.0000 mg | INTRAMUSCULAR | Status: AC
  Administered 2017-02-16: 10 mg via INTRAVENOUS
  Filled 2017-02-16: qty 1

## 2017-02-16 MED ORDER — HYDRALAZINE HCL 20 MG/ML IJ SOLN
5.0000 mg | Freq: Once | INTRAMUSCULAR | Status: AC
  Administered 2017-02-16: 5 mg via INTRAVENOUS

## 2017-02-16 MED ORDER — ALPRAZOLAM 0.5 MG PO TABS
0.5000 mg | ORAL_TABLET | Freq: Two times a day (BID) | ORAL | Status: DC | PRN
  Administered 2017-02-16 – 2017-02-17 (×3): 0.5 mg via ORAL
  Filled 2017-02-16 (×3): qty 1

## 2017-02-16 NOTE — Progress Notes (Signed)
Patient refuses BP at this time.

## 2017-02-16 NOTE — Progress Notes (Addendum)
HD #4 24 5/7 weeks Superimposed preeclampsia still on magnesium BMZ complete Anxiety disorder  S; denies h/a, visual changes (+) FM Requesting longer interval time to recheck BP when elevated  Due to her anxiety  O:  Patient Vitals for the past 24 hrs:  BP Temp Temp src Pulse Resp SpO2 Weight  02/16/17 1040 (!) 165/92 - - 81 - - -  02/16/17 0951 (!) 165/89 - - 89 - - -  02/16/17 0924 (!) 171/94 - - - - - -  02/16/17 0844 (!) 165/100 - - 72 - - -  02/16/17 0821 (!) 164/95 - - 72 - - -  02/16/17 0800 (!) 169/91 98 F (36.7 C) - 73 18 94 % -  02/16/17 0617 - - - - - - 120.7 kg (266 lb)  02/16/17 0430 (!) 154/88 - - 73 - - -  02/16/17 0403 (!) 161/85 (!) 97.5 F (36.4 C) Oral 77 - 97 % -  02/16/17 0035 (!) 147/81 - - 78 - - -  02/15/17 2354 (!) 160/86 98.3 F (36.8 C) Oral 75 18 98 % -  02/15/17 2130 (!) 156/83 - - 70 - - -  02/15/17 2100 (!) 163/94 - - 80 - - -  02/15/17 2030 (!) 169/91 98.4 F (36.9 C) Oral 81 18 97 % -  02/15/17 1610 (!) 154/88 - - 92 18 98 % -  02/15/17 1510 (!) 175/99 - - 83 18 - -  02/15/17 1400 (!) 155/84 - - 84 18 - -  02/15/17 1315 (!) 157/93 - - 82 18 - -  02/15/17 1238 (!) 159/84 - - 79 18 - -  02/15/17 1159 (!) 157/93 - - 84 18 - -   Last labs:  CBC    Component Value Date/Time   WBC 14.5 (H) 02/16/2017 0511   RBC 3.91 02/16/2017 0511   HGB 11.3 (L) 02/16/2017 0511   HCT 33.2 (L) 02/16/2017 0511   PLT 185 02/16/2017 0511   MCV 84.9 02/16/2017 0511   MCH 28.9 02/16/2017 0511   MCHC 34.0 02/16/2017 0511   RDW 14.9 02/16/2017 0511   LYMPHSABS 1.5 02/14/2017 0150   MONOABS 0.0 (L) 02/14/2017 0150   EOSABS 0.0 02/14/2017 0150   BASOSABS 0.0 02/14/2017 0150   CMP Latest Ref Rng & Units 02/16/2017 02/15/2017 02/15/2017  Glucose 65 - 99 mg/dL 811(B113(H) 147(W152(H) 295(A148(H)  BUN 6 - 20 mg/dL 18 16 15   Creatinine 0.44 - 1.00 mg/dL 2.130.94 0.86(V1.10(H) 7.840.95  Sodium 135 - 145 mmol/L 134(L) 132(L) 132(L)  Potassium 3.5 - 5.1 mmol/L 3.9 4.5 4.5  Chloride 101 - 111  mmol/L 107 104 107  CO2 22 - 32 mmol/L 19(L) 19(L) 18(L)  Calcium 8.9 - 10.3 mg/dL 6.2(LL) 6.6(L) 6.5(L)  Total Protein 6.5 - 8.1 g/dL 5.3(L) 6.2(L) 6.0(L)  Total Bilirubin 0.3 - 1.2 mg/dL 0.4 6.9(G0.1(L) 0.3  Alkaline Phos 38 - 126 U/L 62 70 69  AST 15 - 41 U/L 29 39 29  ALT 14 - 54 U/L 45 52 47  calculated calcium/albumin =7.2 Lungs clear to A Cor RRR Abd obese soft nontender Extremity (+) 2 edema DTR 3+ (-) clonus Tracing: baseline140 NR  IMP: chronic HTN with superimposed preeclampsia on magnesium BMZ complete. Seen by MFM, NICU Anxiety disorder IUP@ 24 5/7 weeks Previous myomectomy P) increase labetalol  dose. IV apresoline now. Recheck PIH labs now . Stop magnesium when current labs known cont inpt. Anxiety med prn

## 2017-02-16 NOTE — Progress Notes (Addendum)
I was in room when RN took last BP 197/100. Asymptomatic at present. Magnesium was turned off 2 hrs back and I just completed a long discussion about severe PEC and indications for delivery, parameters we are monitoring for her and the baby. Pt says BP worse due to anxiety about high BPs and discussion about delivery plans.  Reviewed latest labs are normal. U/op is good. Exam was normal with no lungs crackles/ rales/ wheezing and DTR +3/+3. However we need to stay proactive with BP control to be able to continue to stay pregnant.  S/p Hydralazine 5 mg prior to this BP. On Labetalol 600mg  q8h since this AM Added Procardia 30mg  XL this afternoon and will increase if needed  Added Buspar bid and adding Xanax bid prn.  I believe we may see worsening BPs as steroid course >72 hrs and transient improvement wearing off.  Continue to monitor and will initiate HTN protocol.   Sono today/ Dr Sherrie Georgeecker 02/16/17 --> Breech, 534 gm, 1 lb 3 oz at17 % AFI normal, placenta posterior BPP was on 02/13/17 - 8/8.   Also reviewed with patient- C/section delivery and spinal v/s general anesthesia depending on urgency of C-section.  Risks/complications of surgery reviewed incl infection, bleeding, damage to internal organs including bladder, bowels, ureters, blood vessels, other risks from anesthesia, VTE and delayed complications of any surgery, complications in future surgery reviewed.   Reviewed PP worsening of PEC as well with extended hospitalization.  Patient had NICU consult with Dr Mikle Boswortharlos and understands discussion well. She had MFM consult and understands and I have reviewed it all again. Anesthesia team aware  40 minutes in room, face to face   Remains inpatient until delivery.   V.Lerae Langham,MD

## 2017-02-16 NOTE — Progress Notes (Signed)
RN Called BP 177/94. Pt attributing to anxiety. Will start Buspar 10mg  bid rather than Xanax prn. Magnesium 48hrs +. So will stop now.  Hydralazine 5 mg IV now.  Will add Procardia 30mg  XL in addition to cont Labetalol 600mg  q8hr.

## 2017-02-16 NOTE — Progress Notes (Signed)
CRITICAL VALUE ALERT  Critical Value:  Calcium = 6.2  Date & Time Notied:  02/16/17 0625  Provider Notified: Cherly Hensenousins  Orders Received/Actions taken: No new orders

## 2017-02-17 ENCOUNTER — Encounter (HOSPITAL_COMMUNITY)
Admission: AD | Disposition: A | Discharge status: Home / Self Care | Payer: Self-pay | Source: Ambulatory Visit | Attending: Obstetrics & Gynecology

## 2017-02-17 ENCOUNTER — Encounter (HOSPITAL_COMMUNITY): Payer: Self-pay | Admitting: Obstetrics & Gynecology

## 2017-02-17 ENCOUNTER — Inpatient Hospital Stay (HOSPITAL_COMMUNITY): Payer: 59 | Admitting: Certified Registered Nurse Anesthetist

## 2017-02-17 DIAGNOSIS — E039 Hypothyroidism, unspecified: Secondary | ICD-10-CM

## 2017-02-17 DIAGNOSIS — O119 Pre-existing hypertension with pre-eclampsia, unspecified trimester: Secondary | ICD-10-CM | POA: Diagnosis present

## 2017-02-17 DIAGNOSIS — O24410 Gestational diabetes mellitus in pregnancy, diet controlled: Secondary | ICD-10-CM

## 2017-02-17 DIAGNOSIS — I10 Essential (primary) hypertension: Secondary | ICD-10-CM

## 2017-02-17 DIAGNOSIS — O09819 Supervision of pregnancy resulting from assisted reproductive technology, unspecified trimester: Secondary | ICD-10-CM

## 2017-02-17 DIAGNOSIS — J45909 Unspecified asthma, uncomplicated: Secondary | ICD-10-CM

## 2017-02-17 DIAGNOSIS — F411 Generalized anxiety disorder: Secondary | ICD-10-CM

## 2017-02-17 DIAGNOSIS — E669 Obesity, unspecified: Secondary | ICD-10-CM

## 2017-02-17 HISTORY — DX: Generalized anxiety disorder: F41.1

## 2017-02-17 HISTORY — DX: Unspecified asthma, uncomplicated: J45.909

## 2017-02-17 HISTORY — DX: Hypothyroidism, unspecified: E03.9

## 2017-02-17 HISTORY — DX: Gestational diabetes mellitus in pregnancy, diet controlled: O24.410

## 2017-02-17 HISTORY — DX: Essential (primary) hypertension: I10

## 2017-02-17 HISTORY — DX: Obesity, unspecified: E66.9

## 2017-02-17 HISTORY — DX: Supervision of pregnancy resulting from assisted reproductive technology, unspecified trimester: O09.819

## 2017-02-17 LAB — COMPREHENSIVE METABOLIC PANEL
ALBUMIN: 2.8 g/dL — AB (ref 3.5–5.0)
ALT: 55 U/L — ABNORMAL HIGH (ref 14–54)
ANION GAP: 8 (ref 5–15)
AST: 36 U/L (ref 15–41)
Alkaline Phosphatase: 65 U/L (ref 38–126)
BILIRUBIN TOTAL: 0.3 mg/dL (ref 0.3–1.2)
BUN: 19 mg/dL (ref 6–20)
CO2: 18 mmol/L — ABNORMAL LOW (ref 22–32)
Calcium: 7.3 mg/dL — ABNORMAL LOW (ref 8.9–10.3)
Chloride: 108 mmol/L (ref 101–111)
Creatinine, Ser: 0.88 mg/dL (ref 0.44–1.00)
GFR calc Af Amer: >60 mL/min (ref >60)
Glucose, Bld: 88 mg/dL (ref 65–99)
POTASSIUM: 3.8 mmol/L (ref 3.5–5.1)
Sodium: 134 mmol/L — ABNORMAL LOW (ref 135–145)
TOTAL PROTEIN: 5.8 g/dL — AB (ref 6.5–8.1)

## 2017-02-17 LAB — CBC
HCT: 35.2 % — ABNORMAL LOW (ref 36.0–46.0)
HEMATOCRIT: 36.7 % (ref 36.0–46.0)
Hemoglobin: 11.8 g/dL — ABNORMAL LOW (ref 12.0–15.0)
Hemoglobin: 11.6 g/dL — ABNORMAL LOW (ref 12.0–15.0)
MCH: 27.6 pg (ref 26.0–34.0)
MCH: 28.2 pg (ref 26.0–34.0)
MCHC: 32.2 g/dL (ref 30.0–36.0)
MCHC: 33 g/dL (ref 30.0–36.0)
MCV: 85.7 fL (ref 78.0–100.0)
MCV: 85.6 fL (ref 78.0–100.0)
PLATELETS: 197 10*3/uL (ref 150–400)
Platelets: 215 10*3/uL (ref 150–400)
RBC: 4.28 MIL/uL (ref 3.87–5.11)
RBC: 4.11 MIL/uL (ref 3.87–5.11)
RDW: 15.1 % (ref 11.5–15.5)
RDW: 14.9 % (ref 11.5–15.5)
WBC: 14.8 10*3/uL — ABNORMAL HIGH (ref 4.0–10.5)
WBC: 13.8 10*3/uL — ABNORMAL HIGH (ref 4.0–10.5)

## 2017-02-17 LAB — TSH: TSH: 9.273 u[IU]/mL — ABNORMAL HIGH (ref 0.350–4.500)

## 2017-02-17 LAB — T4, FREE: FREE T4: 0.59 ng/dL — AB (ref 0.61–1.12)

## 2017-02-17 LAB — GLUCOSE, CAPILLARY
GLUCOSE-CAPILLARY: 88 mg/dL (ref 65–99)
Glucose-Capillary: 84 mg/dL (ref 65–99)

## 2017-02-17 SURGERY — Surgical Case
Anesthesia: Spinal

## 2017-02-17 MED ORDER — KETOROLAC TROMETHAMINE 30 MG/ML IJ SOLN: 30.0000 mg | Freq: Four times a day (QID) | INTRAMUSCULAR | Status: AC | PRN

## 2017-02-17 MED ORDER — OXYTOCIN 10 UNIT/ML IJ SOLN: INTRAVENOUS | Status: DC | PRN

## 2017-02-17 MED ORDER — ACETAMINOPHEN 500 MG PO TABS
1000.0000 mg | ORAL_TABLET | Freq: Four times a day (QID) | ORAL | Status: AC
  Administered 2017-02-17 – 2017-02-18 (×4): 1000 mg via ORAL
  Filled 2017-02-17 (×4): qty 2

## 2017-02-17 MED ORDER — DIPHENHYDRAMINE HCL 25 MG PO CAPS: 25.0000 mg | ORAL_CAPSULE | Freq: Four times a day (QID) | ORAL | Status: DC | PRN

## 2017-02-17 MED ORDER — DIPHENHYDRAMINE HCL 50 MG/ML IJ SOLN
INTRAMUSCULAR | Status: AC
  Filled 2017-02-17: qty 1

## 2017-02-17 MED ORDER — HYDROMORPHONE HCL 1 MG/ML IJ SOLN
0.2500 mg | INTRAMUSCULAR | Status: DC | PRN
  Administered 2017-02-17 (×2): 0.25 mg via INTRAVENOUS

## 2017-02-17 MED ORDER — MAGNESIUM SULFATE BOLUS VIA INFUSION
6.0000 g | Freq: Once | INTRAVENOUS | Status: AC
  Administered 2017-02-17: 6 g via INTRAVENOUS
  Filled 2017-02-17: qty 500

## 2017-02-17 MED ORDER — FENTANYL CITRATE (PF) 100 MCG/2ML IJ SOLN
INTRAMUSCULAR | Status: DC | PRN
  Administered 2017-02-17: 20 ug via INTRATHECAL

## 2017-02-17 MED ORDER — ALBUTEROL SULFATE (2.5 MG/3ML) 0.083% IN NEBU: 3.0000 mL | INHALATION_SOLUTION | Freq: Four times a day (QID) | RESPIRATORY_TRACT | Status: DC | PRN

## 2017-02-17 MED ORDER — LACTATED RINGERS IV SOLN
INTRAVENOUS | Status: DC | PRN
  Administered 2017-02-17: 17:00:00 via INTRAVENOUS

## 2017-02-17 MED ORDER — TETANUS-DIPHTH-ACELL PERTUSSIS 5-2.5-18.5 LF-MCG/0.5 IM SUSP
0.5000 mL | Freq: Once | INTRAMUSCULAR | Status: AC
  Administered 2017-02-19: 0.5 mL via INTRAMUSCULAR
  Filled 2017-02-17: qty 0.5

## 2017-02-17 MED ORDER — LABETALOL HCL 200 MG PO TABS
800.0000 mg | ORAL_TABLET | Freq: Three times a day (TID) | ORAL | Status: DC
  Administered 2017-02-17 – 2017-02-21 (×11): 800 mg via ORAL
  Filled 2017-02-17: qty 4
  Filled 2017-02-17: qty 8
  Filled 2017-02-17 (×5): qty 4
  Filled 2017-02-17: qty 8
  Filled 2017-02-17 (×3): qty 4

## 2017-02-17 MED ORDER — ONDANSETRON HCL 4 MG/2ML IJ SOLN
4.0000 mg | Freq: Three times a day (TID) | INTRAMUSCULAR | Status: DC | PRN
  Administered 2017-02-18 – 2017-02-19 (×2): 4 mg via INTRAVENOUS
  Filled 2017-02-17 (×3): qty 2

## 2017-02-17 MED ORDER — SODIUM CHLORIDE 0.9 % IR SOLN
Status: DC | PRN
  Administered 2017-02-17: 1

## 2017-02-17 MED ORDER — NALBUPHINE HCL 10 MG/ML IJ SOLN: 5.0000 mg | INTRAMUSCULAR | Status: DC | PRN

## 2017-02-17 MED ORDER — PANTOPRAZOLE SODIUM 40 MG PO TBEC: 40.0000 mg | DELAYED_RELEASE_TABLET | Freq: Every day | ORAL | Status: DC

## 2017-02-17 MED ORDER — HYDROMORPHONE HCL 1 MG/ML IJ SOLN
INTRAMUSCULAR | Status: AC
  Filled 2017-02-17: qty 0.5

## 2017-02-17 MED ORDER — DIPHENHYDRAMINE HCL 25 MG PO CAPS: 25.0000 mg | ORAL_CAPSULE | ORAL | Status: DC | PRN

## 2017-02-17 MED ORDER — KETOROLAC TROMETHAMINE 30 MG/ML IJ SOLN
INTRAMUSCULAR | Status: AC
  Filled 2017-02-17: qty 1

## 2017-02-17 MED ORDER — ONDANSETRON HCL 4 MG/2ML IJ SOLN
INTRAMUSCULAR | Status: DC | PRN
  Administered 2017-02-17: 4 mg via INTRAVENOUS

## 2017-02-17 MED ORDER — LACTATED RINGERS IV SOLN
INTRAVENOUS | Status: DC
  Administered 2017-02-17 – 2017-02-18 (×2): via INTRAVENOUS

## 2017-02-17 MED ORDER — LABETALOL HCL 5 MG/ML IV SOLN
INTRAVENOUS | Status: AC
  Filled 2017-02-17: qty 4

## 2017-02-17 MED ORDER — DEXAMETHASONE SODIUM PHOSPHATE 4 MG/ML IJ SOLN
INTRAMUSCULAR | Status: DC | PRN
  Administered 2017-02-17: 4 mg via INTRAVENOUS

## 2017-02-17 MED ORDER — MAGNESIUM SULFATE 40 G IN LACTATED RINGERS - SIMPLE
2.0000 g/h | INTRAVENOUS | Status: DC
  Filled 2017-02-17: qty 500

## 2017-02-17 MED ORDER — FENTANYL CITRATE (PF) 100 MCG/2ML IJ SOLN
INTRAMUSCULAR | Status: AC
  Filled 2017-02-17: qty 2

## 2017-02-17 MED ORDER — MEPERIDINE HCL 25 MG/ML IJ SOLN: 6.2500 mg | INTRAMUSCULAR | Status: DC | PRN

## 2017-02-17 MED ORDER — LORATADINE 10 MG PO TABS
10.0000 mg | ORAL_TABLET | Freq: Every day | ORAL | Status: DC
  Filled 2017-02-17 (×5): qty 1

## 2017-02-17 MED ORDER — LABETALOL HCL 200 MG PO TABS
200.0000 mg | ORAL_TABLET | Freq: Once | ORAL | Status: AC
  Administered 2017-02-17: 200 mg via ORAL
  Filled 2017-02-17: qty 1

## 2017-02-17 MED ORDER — ACETAMINOPHEN-CODEINE #3 300-30 MG PO TABS: 1.0000 | ORAL_TABLET | Freq: Three times a day (TID) | ORAL | Status: DC | PRN

## 2017-02-17 MED ORDER — SODIUM CHLORIDE 0.9% FLUSH: 3.0000 mL | INTRAVENOUS | Status: DC | PRN

## 2017-02-17 MED ORDER — NALOXONE HCL 4 MG/10ML IJ SOLN: 1.0000 ug/kg/h | INTRAVENOUS | Status: DC | PRN

## 2017-02-17 MED ORDER — LIDOCAINE HCL (PF) 1 % IJ SOLN
INTRAMUSCULAR | Status: AC
  Filled 2017-02-17: qty 5

## 2017-02-17 MED ORDER — HYDRALAZINE HCL 20 MG/ML IJ SOLN
10.0000 mg | Freq: Once | INTRAMUSCULAR | Status: AC
  Administered 2017-02-17: 10 mg via INTRAVENOUS

## 2017-02-17 MED ORDER — LABETALOL HCL 200 MG PO TABS
800.0000 mg | ORAL_TABLET | Freq: Three times a day (TID) | ORAL | Status: DC
  Filled 2017-02-17: qty 4

## 2017-02-17 MED ORDER — ACETAMINOPHEN 325 MG PO TABS
650.0000 mg | ORAL_TABLET | Freq: Four times a day (QID) | ORAL | Status: DC | PRN
  Administered 2017-02-18 – 2017-02-20 (×6): 650 mg via ORAL
  Filled 2017-02-17 (×7): qty 2

## 2017-02-17 MED ORDER — BUPIVACAINE IN DEXTROSE 0.75-8.25 % IT SOLN
INTRATHECAL | Status: DC | PRN
  Administered 2017-02-17: 1.6 mL via INTRATHECAL

## 2017-02-17 MED ORDER — LEVOTHYROXINE SODIUM 150 MCG PO TABS: 150.0000 ug | ORAL_TABLET | Freq: Every day | ORAL | Status: DC

## 2017-02-17 MED ORDER — MORPHINE SULFATE (PF) 0.5 MG/ML IJ SOLN
INTRAMUSCULAR | Status: AC
  Filled 2017-02-17: qty 10

## 2017-02-17 MED ORDER — NALBUPHINE HCL 10 MG/ML IJ SOLN: 5.0000 mg | Freq: Once | INTRAMUSCULAR | Status: DC | PRN

## 2017-02-17 MED ORDER — HYDRALAZINE HCL 20 MG/ML IJ SOLN
INTRAMUSCULAR | Status: AC
  Filled 2017-02-17: qty 1

## 2017-02-17 MED ORDER — MAGNESIUM SULFATE 40 G IN LACTATED RINGERS - SIMPLE: 2.0000 g/h | INTRAVENOUS | Status: DC

## 2017-02-17 MED ORDER — MORPHINE SULFATE (PF) 0.5 MG/ML IJ SOLN
INTRAMUSCULAR | Status: DC | PRN
  Administered 2017-02-17: .2 mg via INTRATHECAL

## 2017-02-17 MED ORDER — DIPHENHYDRAMINE HCL 50 MG/ML IJ SOLN: 12.5000 mg | INTRAMUSCULAR | Status: DC | PRN

## 2017-02-17 MED ORDER — DEXAMETHASONE SODIUM PHOSPHATE 4 MG/ML IJ SOLN
INTRAMUSCULAR | Status: AC
  Filled 2017-02-17: qty 1

## 2017-02-17 MED ORDER — GENTAMICIN SULFATE 40 MG/ML IJ SOLN
Freq: Once | INTRAVENOUS | Status: AC
  Administered 2017-02-17: 100 mL via INTRAVENOUS
  Filled 2017-02-17: qty 10.25

## 2017-02-17 MED ORDER — OXYTOCIN 40 UNITS IN LACTATED RINGERS INFUSION - SIMPLE MED: 2.5000 [IU]/h | INTRAVENOUS | Status: AC

## 2017-02-17 MED ORDER — HYDRALAZINE HCL 20 MG/ML IJ SOLN: 10.0000 mg | Freq: Once | INTRAMUSCULAR | Status: DC | PRN

## 2017-02-17 MED ORDER — ONDANSETRON HCL 4 MG/2ML IJ SOLN
INTRAMUSCULAR | Status: AC
  Filled 2017-02-17: qty 2

## 2017-02-17 MED ORDER — FLUTICASONE PROPIONATE 50 MCG/ACT NA SUSP: 1.0000 | Freq: Every day | NASAL | Status: DC

## 2017-02-17 MED ORDER — PROMETHAZINE HCL 25 MG/ML IJ SOLN: 6.2500 mg | INTRAMUSCULAR | Status: DC | PRN

## 2017-02-17 MED ORDER — LABETALOL HCL 5 MG/ML IV SOLN
20.0000 mg | INTRAVENOUS | Status: AC | PRN
  Administered 2017-02-17 – 2017-02-20 (×3): 20 mg via INTRAVENOUS
  Filled 2017-02-17: qty 4

## 2017-02-17 MED ORDER — SIMETHICONE 80 MG PO CHEW
80.0000 mg | CHEWABLE_TABLET | ORAL | Status: DC
  Administered 2017-02-19 – 2017-02-20 (×3): 80 mg via ORAL
  Filled 2017-02-17 (×4): qty 1

## 2017-02-17 MED ORDER — MAGNESIUM SULFATE 40 G IN LACTATED RINGERS - SIMPLE
2.0000 g/h | INTRAVENOUS | Status: AC
  Administered 2017-02-18: 2 g/h via INTRAVENOUS
  Filled 2017-02-17: qty 500
  Filled 2017-02-17: qty 40

## 2017-02-17 MED ORDER — NAPHAZOLINE-PHENIRAMINE 0.025-0.3 % OP SOLN: 1.0000 [drp] | Freq: Four times a day (QID) | OPHTHALMIC | Status: DC | PRN

## 2017-02-17 MED ORDER — SENNOSIDES-DOCUSATE SODIUM 8.6-50 MG PO TABS
2.0000 | ORAL_TABLET | ORAL | Status: DC
  Administered 2017-02-17 – 2017-02-20 (×3): 2 via ORAL
  Filled 2017-02-17 (×3): qty 2

## 2017-02-17 MED ORDER — LEVOTHYROXINE SODIUM 100 MCG PO TABS
150.0000 ug | ORAL_TABLET | Freq: Every day | ORAL | Status: DC
  Administered 2017-02-18 – 2017-02-21 (×4): 150 ug via ORAL

## 2017-02-17 MED ORDER — SCOPOLAMINE 1 MG/3DAYS TD PT72
MEDICATED_PATCH | TRANSDERMAL | Status: DC | PRN
  Administered 2017-02-17: 1 via TRANSDERMAL

## 2017-02-17 MED ORDER — COCONUT OIL OIL
1.0000 "application " | TOPICAL_OIL | Status: DC | PRN
  Administered 2017-02-20: 1 via TOPICAL
  Filled 2017-02-17: qty 120

## 2017-02-17 MED ORDER — NALOXONE HCL 0.4 MG/ML IJ SOLN: 0.4000 mg | INTRAMUSCULAR | Status: DC | PRN

## 2017-02-17 MED ORDER — KETOROLAC TROMETHAMINE 30 MG/ML IJ SOLN
30.0000 mg | Freq: Once | INTRAMUSCULAR | Status: DC | PRN
  Administered 2017-02-17: 30 mg via INTRAVENOUS

## 2017-02-17 MED ORDER — PHENYLEPHRINE 8 MG IN D5W 100 ML (0.08MG/ML) PREMIX OPTIME
INJECTION | INTRAVENOUS | Status: DC | PRN
  Administered 2017-02-17: 60 ug/min via INTRAVENOUS

## 2017-02-17 MED ORDER — CALCIUM CARBONATE ANTACID 500 MG PO CHEW: 1.0000 | CHEWABLE_TABLET | Freq: Two times a day (BID) | ORAL | Status: DC | PRN

## 2017-02-17 MED ORDER — MENTHOL 3 MG MT LOZG: 1.0000 | LOZENGE | OROMUCOSAL | Status: DC | PRN

## 2017-02-17 MED ORDER — SIMETHICONE 80 MG PO CHEW: 80.0000 mg | CHEWABLE_TABLET | ORAL | Status: DC | PRN

## 2017-02-17 MED ORDER — PHENYLEPHRINE 8 MG IN D5W 100 ML (0.08MG/ML) PREMIX OPTIME
INJECTION | INTRAVENOUS | Status: AC
  Filled 2017-02-17: qty 100

## 2017-02-17 MED ORDER — OXYTOCIN 10 UNIT/ML IJ SOLN
INTRAMUSCULAR | Status: AC
Start: 2017-02-17 — End: ?
  Filled 2017-02-17: qty 4

## 2017-02-17 MED ORDER — OXYTOCIN 10 UNIT/ML IJ SOLN
INTRAVENOUS | Status: DC | PRN
  Administered 2017-02-17: 40 [IU] via INTRAVENOUS

## 2017-02-17 MED ORDER — SCOPOLAMINE 1 MG/3DAYS TD PT72: 1.0000 | MEDICATED_PATCH | Freq: Once | TRANSDERMAL | Status: DC

## 2017-02-17 MED ORDER — SIMETHICONE 80 MG PO CHEW
80.0000 mg | CHEWABLE_TABLET | Freq: Three times a day (TID) | ORAL | Status: DC
  Administered 2017-02-18 – 2017-02-21 (×10): 80 mg via ORAL
  Filled 2017-02-17 (×10): qty 1

## 2017-02-17 MED ORDER — THYROID 130 MG PO TABS
130.0000 mg | ORAL_TABLET | Freq: Every morning | ORAL | Status: DC
  Administered 2017-02-18 – 2017-02-21 (×4): 130 mg via ORAL

## 2017-02-17 MED ORDER — SOD CITRATE-CITRIC ACID 500-334 MG/5ML PO SOLN
ORAL | Status: AC
  Administered 2017-02-17: 30 mL
  Filled 2017-02-17: qty 15

## 2017-02-17 MED ORDER — OXYCODONE HCL 5 MG PO TABS
5.0000 mg | ORAL_TABLET | ORAL | Status: DC | PRN
  Administered 2017-02-17 – 2017-02-20 (×14): 5 mg via ORAL
  Filled 2017-02-17 (×14): qty 1

## 2017-02-17 SURGICAL SUPPLY — 41 items
APL SKNCLS STERI-STRIP NONHPOA (GAUZE/BANDAGES/DRESSINGS) ×1
BARRIER ADHS 3X4 INTERCEED (GAUZE/BANDAGES/DRESSINGS) ×2 IMPLANT
BENZOIN TINCTURE PRP APPL 2/3 (GAUZE/BANDAGES/DRESSINGS) ×2 IMPLANT
BRR ADH 4X3 ABS CNTRL BYND (GAUZE/BANDAGES/DRESSINGS) ×1
CHLORAPREP W/TINT 26ML (MISCELLANEOUS) ×3 IMPLANT
CLAMP CORD UMBIL (MISCELLANEOUS) IMPLANT
CLOSURE STERI STRIP 1/2 X4 (GAUZE/BANDAGES/DRESSINGS) ×2 IMPLANT
CLOSURE WOUND 1/2 X4 (GAUZE/BANDAGES/DRESSINGS)
CLOTH BEACON ORANGE TIMEOUT ST (SAFETY) ×3 IMPLANT
DRSG OPSITE POSTOP 4X10 (GAUZE/BANDAGES/DRESSINGS) ×3 IMPLANT
ELECT REM PT RETURN 9FT ADLT (ELECTROSURGICAL) ×3
ELECTRODE REM PT RTRN 9FT ADLT (ELECTROSURGICAL) ×1 IMPLANT
EXTRACTOR VACUUM KIWI (MISCELLANEOUS) IMPLANT
EXTRACTOR VACUUM M CUP 4 TUBE (SUCTIONS) IMPLANT
EXTRACTOR VACUUM M CUP 4' TUBE (SUCTIONS)
GLOVE BIO SURGEON STRL SZ7 (GLOVE) ×3 IMPLANT
GLOVE BIOGEL PI IND STRL 7.0 (GLOVE) ×2 IMPLANT
GLOVE BIOGEL PI INDICATOR 7.0 (GLOVE) ×4
GOWN STRL REUS W/TWL LRG LVL3 (GOWN DISPOSABLE) ×6 IMPLANT
HOVERMATT SINGLE USE (MISCELLANEOUS) ×2 IMPLANT
KIT ABG SYR 3ML LUER SLIP (SYRINGE) IMPLANT
NEEDLE HYPO 25X5/8 SAFETYGLIDE (NEEDLE) ×3 IMPLANT
NS IRRIG 1000ML POUR BTL (IV SOLUTION) ×3 IMPLANT
PACK C SECTION WH (CUSTOM PROCEDURE TRAY) ×3 IMPLANT
PAD OB MATERNITY 4.3X12.25 (PERSONAL CARE ITEMS) ×3 IMPLANT
RETAINER VISCERAL (MISCELLANEOUS) ×2 IMPLANT
RTRCTR C-SECT PINK 25CM LRG (MISCELLANEOUS) IMPLANT
STRIP CLOSURE SKIN 1/2X4 (GAUZE/BANDAGES/DRESSINGS) IMPLANT
SUT MON AB-0 CT1 36 (SUTURE) ×6 IMPLANT
SUT PLAIN 0 NONE (SUTURE) IMPLANT
SUT PLAIN 2 0 (SUTURE) ×3
SUT PLAIN ABS 2-0 CT1 27XMFL (SUTURE) IMPLANT
SUT VIC AB 0 CT1 27 (SUTURE) ×6
SUT VIC AB 0 CT1 27XBRD ANBCTR (SUTURE) ×2 IMPLANT
SUT VIC AB 2-0 CT1 27 (SUTURE) ×3
SUT VIC AB 2-0 CT1 TAPERPNT 27 (SUTURE) ×1 IMPLANT
SUT VIC AB 4-0 KS 27 (SUTURE) ×3 IMPLANT
SUT VICRYL 0 TIES 12 18 (SUTURE) IMPLANT
TOWEL OR 17X24 6PK STRL BLUE (TOWEL DISPOSABLE) ×3 IMPLANT
TRAY FOLEY BAG SILVER LF 14FR (SET/KITS/TRAYS/PACK) IMPLANT
WATER STERILE IRR 1000ML POUR (IV SOLUTION) ×2 IMPLANT

## 2017-02-17 NOTE — Progress Notes (Signed)
Severe superimposed preeclampsia resistant to antiHTN agents- Labetalol increased to 800 mg since this morning, Procardia 30mg  XL added yesterday, received 25 mg Hydralazine IV pushes (5, 10, 10 mg) since last evening. Patient reports anxiety not a component today as she feels very relaxed since slept well last night.  At this point we have completed >48 hrs steroids and I dont see her getting more weeks with her severe range BPs for fetal weight gain and there are more maternal and fetal risks from severe range BPs, so proceeding with delivery is indicated now.  Patient was counseled repeatedly by me and Dr Ernestina PennaFogleman this afternoon as well and agrees.  Continue mag-sulfate pp and ICU admission.  NICU aware.  Moving to OR now.

## 2017-02-17 NOTE — Progress Notes (Signed)
No HA, no vision change, hungry. Good FM, no LOF, no VB. " I don't feel anxious, this is something else"  PE: Gen: swollen CV: RRR Pulm: CTAB, no crackles Abd: no RUQ pain, no fundal tendernes LE: 4+ LE edema, NT, no clonus, 2+ DTR Toco: no ctx FH: 5-10 beat variability, no decels  CBC Latest Ref Rng & Units 02/17/2017 02/17/2017 02/16/2017  WBC 4.0 - 10.5 K/uL 13.8(H) 14.8(H) 16.8(H)  Hemoglobin 12.0 - 15.0 g/dL 11.6(L) 11.8(L) 12.1  Hematocrit 36.0 - 46.0 % 35.2(L) 36.7 36.8  Platelets 150 - 400 K/uL 197 215 213   CMP Latest Ref Rng & Units 02/17/2017 02/16/2017 02/16/2017  Glucose 65 - 99 mg/dL 88 409(W102(H) 119(J113(H)  BUN 6 - 20 mg/dL 19 17 18   Creatinine 0.44 - 1.00 mg/dL 4.780.88 2.950.97 6.210.94  Sodium 135 - 145 mmol/L 134(L) 134(L) 134(L)  Potassium 3.5 - 5.1 mmol/L 3.8 3.9 3.9  Chloride 101 - 111 mmol/L 108 107 107  CO2 22 - 32 mmol/L 18(L) 18(L) 19(L)  Calcium 8.9 - 10.3 mg/dL 7.3(L) 6.5(L) 6.2(LL)  Total Protein 6.5 - 8.1 g/dL 3.0(Q5.8(L) 6.5(H5.8(L) 5.3(L)  Total Bilirubin 0.3 - 1.2 mg/dL 0.3 8.4(O0.2(L) 0.4  Alkaline Phos 38 - 126 U/L 65 70 62  AST 15 - 41 U/L 36 38 29  ALT 14 - 54 U/L 55(H) 51 45   A/P: 24'6, PEC with severe features now with uncontrolled bp's despite max therapy. Increase in edema and starting to see increase in ALT and drop in plts, all after intial stability after BMZ. High bps due to PEC , not anxiety as pulse low and pt does not feel anxious. Will move to delivery. Pt understands R/B and baby to NICU for support. Pt understands classical c/s with need for future delivery by c/s. Given that pt has low pulse will not give additional IV labetalol and pt has hit max dose IV hydralizine. Will watch bps for now and not push additional meds unless pt with sx. Expect bp to come down some with start of Mag.   Lendon ColonelKelly A Yomayra Tate 02/17/2017 3:04 PM

## 2017-02-17 NOTE — Anesthesia Preprocedure Evaluation (Signed)
Anesthesia Evaluation  Patient identified by MRN, date of birth, ID band Patient awake    Reviewed: Allergy & Precautions, H&P , NPO status , Patient's Chart, lab work & pertinent test results, reviewed documented beta blocker date and time   Airway Mallampati: III  TM Distance: >3 FB Neck ROM: full    Dental no notable dental hx. (+) Teeth Intact   Pulmonary neg pulmonary ROS,    Pulmonary exam normal breath sounds clear to auscultation       Cardiovascular hypertension, Pt. on medications and Pt. on home beta blockers negative cardio ROS Normal cardiovascular exam Rhythm:regular Rate:Normal     Neuro/Psych PSYCHIATRIC DISORDERS Anxiety negative neurological ROS     GI/Hepatic negative GI ROS, Neg liver ROS,   Endo/Other  diabetesMorbid obesity  Renal/GU negative Renal ROS     Musculoskeletal   Abdominal (+) + obese,   Peds  Hematology negative hematology ROS (+) Blood dyscrasia, anemia ,   Anesthesia Other Findings   Reproductive/Obstetrics (+) Pregnancy                             Anesthesia Physical Anesthesia Plan  ASA: III  Anesthesia Plan: Spinal   Post-op Pain Management:    Induction:   PONV Risk Score and Plan: Ondansetron, Dexamethasone and Scopolamine patch - Pre-op  Airway Management Planned: Natural Airway and Nasal Cannula  Additional Equipment:   Intra-op Plan:   Post-operative Plan:   Informed Consent: I have reviewed the patients History and Physical, chart, labs and discussed the procedure including the risks, benefits and alternatives for the proposed anesthesia with the patient or authorized representative who has indicated his/her understanding and acceptance.     Plan Discussed with: CRNA and Surgeon  Anesthesia Plan Comments:         Anesthesia Quick Evaluation

## 2017-02-17 NOTE — Progress Notes (Signed)
Initial Nutrition Assessment  DOCUMENTATION CODES:   Morbid obesity  INTERVENTION:  Gestational Diabetic Carbohydrate modified diet ( elevated 1 hr GTT) Glucose levels have been wnl after effect of BMZ has worn off  NUTRITION DIAGNOSIS:   Increased nutrient needs related to (pregnancy and fetal growth requirements) as evidenced by (24 weeks IUP).  GOAL:  Patient will meet greater than or equal to 90% of their needs  MONITOR:  Labs  REASON FOR ASSESSMENT:  Antenatal   ASSESSMENT:   24 6/7 weeks, severe preeclampsia. Elevated 1 hr GTT of 180. Obesity.  weight at 10 week PNV 242 lbs, (BMI 40.4)adm weight on 2/2 257 lbs, up overall 15 lbs. Pt has experienced another 10 lb weight gain in 4 days since adm on 2/2  Diet Order:  Diet gestational carb mod Fluid consistency: Thin; Room service appropriate? Yes  EDUCATION NEEDS:   No education needs have been identified at this time  Skin:  Skin Assessment: Reviewed RN Assessment  Height:   Ht Readings from Last 1 Encounters:  02/13/17 5\' 5"  (1.651 m)    Weight:   Wt Readings from Last 1 Encounters:  02/17/17 267 lb 12 oz (121.5 kg)    Ideal Body Weight:     BMI:  Body mass index is 44.56 kg/m.  Estimated Nutritional Needs:   Kcal:  2300-2500  Protein:  100-110 g  Fluid:  2.6 L    Elisabeth CaraKatherine Kemet Nijjar M.Odis LusterEd. R.D. LDN Neonatal Nutrition Support Specialist/RD III Pager [phone]      Phone [phone]

## 2017-02-17 NOTE — Anesthesia Procedure Notes (Signed)
Spinal  Patient location during procedure: OR Staffing Anesthesiologist: Leilani AbleHatchett, Reggie Welge, MD Performed: anesthesiologist  Preanesthetic Checklist Completed: patient identified, site marked, surgical consent, pre-op evaluation, timeout performed, IV checked, risks and benefits discussed and monitors and equipment checked Spinal Block Patient position: sitting Prep: site prepped and draped and DuraPrep Patient monitoring: continuous pulse ox and blood pressure Approach: midline Location: L3-4 Injection technique: single-shot Needle Needle type: Pencan  Needle gauge: 24 G Needle length: 10 cm Needle insertion depth: 6 cm Assessment Sensory level: T4

## 2017-02-17 NOTE — Transfer of Care (Signed)
Immediate Anesthesia Transfer of Care Note  Patient: Natasha Caldwell  Procedure(s) Performed: CESAREAN SECTION (N/A )  Patient Location: PACU  Anesthesia Type:Spinal  Level of Consciousness: awake, alert , oriented and patient cooperative  Airway & Oxygen Therapy: Patient Spontanous Breathing  Post-op Assessment: Report given to RN  Post vital signs: Reviewed and stable  Last Vitals:  Vitals:   02/17/17 1556 02/17/17 1601  BP: (!) 157/85 (!) 174/94  Pulse: 87 93  Resp:  18  Temp:    SpO2:      Last Pain:  Vitals:   02/17/17 0800  TempSrc: Oral  PainSc:          Complications: No apparent anesthesia complications

## 2017-02-17 NOTE — Op Note (Signed)
02/17/2017 Cesarean Section Procedure Note Natasha Caldwell  Procedure: Primary Classical Cesarean section  Indications:  Chronic hypertention with superimposed preeclampsia, uncontrolled with antihypertensive agents. 24.6 wks, steroid course completed.   Pre-operative Diagnosis: Cesarean Section for severe superimposed preeclampsia.   Post-operative Diagnosis: Same   Surgeon:  Shea EvansMody, Clarie Camey, MD - Primary   Assistants:  Vick FreesAlmquist, Susan E, MD  Anesthesia: spinal   Procedure Details:  The patient was seen in the Antepartum Room. The risks, benefits, complications, treatment options, and expected outcomes were discussed with the patient. The patient concurred with the proposed plan, giving informed consent. identified as Natasha Caldwell and the procedure verified as C-Section Delivery. A Time Out was held and the above information confirmed. Gentamicin/ Clindamycin given per protocol.  After induction of spinal anesthesia, the patient was draped and prepped in the usual sterile manner. A pfannenstiel Incision was made and carried down through the subcutaneous tissue to the fascia. Fascial incision was made and extended transversely. The fascia was separated from the underlying rectus tissue superiorly and inferiorly. The peritoneum was identified and entered. Peritoneal incision was extended longitudinally. Alexis retractor placed. Uterus was very soft and small with under-developed lower segment, so decision for classical hysterotomy was made.  A vertical uterine incision was made and edges were lifted with Allis to prevent fetal injury as the incision was carried down to amniotic bag. Incision was extended in vertical fashion and then amniotomy performed and very slight yellow tinted fluid was drained. Baby was in McDonald ChapelFrank breech position, buttocks elevated out of the lower segment and delivered from hysterotomy and with gentle motion rest of the baby was delivered maintain back anterior. Baby GIRL was born  at 17.39 hours on 02/17/2017. Baby cried at birth and good tone. At 40 seconds cord clamping done and baby handed to NICU team. Apgar scores of 8 at one minute and 10 at five minutes. Cord ph was sent, the umbilical cord blood was obtained for evaluation. The placenta was removed Intact and appeared abnormal - very dry spongy fibrous appearance . The uterine outline, tubes and ovaries appeared normal. The uterine incision edges were clamped with Allis clamps and approximated with manual pressure to bring the edges together. Hysterotomy was closed in 3 layers, first two using 0- Monocryl, continuous locking layer then continuous imbricating layer. 3rd layer was with 2-0 Vicryl baseball stitch to close the incision with excellent hemostasis. Alexis retractor removed. Interceed placed on classical hysterotomy suture line. Peritoneal closure done with 2-0 Vicryl. The fascia was then reapproximated with running sutures of 0Vicryl. The subcuticular closure was performed using 2-0plain gut. The skin was closed with 4-0Vicryl.  Steristrips and Honeycomb dressing placed.  Instrument, sponge, and needle counts were correct prior the abdominal closure and were correct at the conclusion of the case.   Findings: Classical hysterotomy for female infant delivery from frank breech position without complications. Birth at 17.39 hours, Apgars 8 amd 10. NICU transfer of infant in stable condition. Normal cord. Cord gas 7.29. Placenta was small and fibrous/ pale appearance. Normal ovaries and tubes.    Estimated Blood Loss: 621 mL   Total IV Fluids: 2100 ml LR   Urine Output: 100CC OF clear urine  Specimens: Cord gas. Cord blood. Placenta.   Complications: no complications  Disposition: ICU - extubated and stable.   Maternal Condition: stable   Baby condition / location:  NICU  Attending Attestation: I performed the procedure.   Signed: Shea EvansVaishali Paloma Grange, MD

## 2017-02-17 NOTE — Progress Notes (Signed)
The patient is receiving protonix by the intravenous route.  Based on criteria approved by the Pharmacy and Therapeutics Committee and the Medical Executive Committee, the medication is being converted to the equivalent oral dose form.  These criteria include: -No Active GI bleeding -Able to tolerate diet of full liquids (or better) or tube feeding -Able to tolerate other medications by the oral or enteral route  If you have any questions about this conversion, please contact the Pharmacy Department (ext [phone]).  Thank you.  Natasha Caldwell, Natasha Caldwell 02/17/2017

## 2017-02-17 NOTE — Progress Notes (Signed)
24.6 wks, severe superimposed PEC Prematurity Severe Anxiety   S: Slept well last night. No symptoms. Good FMs. No vag bleeding or abdo pain   O: BP (!) 155/83 (BP Location: Left Arm)   Pulse 76   Temp 98.2 F (36.8 C) (Oral)   Resp 18   Ht 5\' 5"  (1.651 m)   Wt 267 lb 12 oz (121.5 kg)   SpO2 97%   BMI 44.56 kg/m    BP  02/17/17 0613 -  02/17/17 0425 136/72  02/16/17 2024 (!) 160/88  02/16/17 1622 (!) 154/86  02/16/17 1612 (!) 163/84  02/16/17 1601 (!) 163/88  02/16/17 1552 (!) 167/94  02/16/17 1547 (!) 197/100  02/16/17 1501 (!) 171/84  02/16/17 1500 (!) 171/84  02/16/17 1355 (!) 185/98  02/16/17 1309 (!) 177/94  02/16/17 1225 (!) 176/94  02/16/17 1131 (!) 181/95  02/16/17 1121 (!) 188/104  02/16/17 1110 (!) 181/98  02/16/17 1040 (!) 165/92  02/16/17 0951 (!) 165/89  02/16/17 0924 (!) 171/94  02/16/17 0923 (!) 171/94  02/16/17 0914 (!) 176/101  02/16/17 0844 (!) 165/100  02/16/17 0821 (!) 164/95  02/16/17 0800 (!) 169/91   Received Hydralazine pushes several times yesterday. Added Procardia 30mg  XL yesterday  Intake/Output Summary (Last 24 hours) at 02/17/2017 0744 Last data filed at 02/17/2017 0612 Gross per 24 hour  Intake 1495 ml  Output 3200 ml  Net -1705 ml   CBC Latest Ref Rng & Units 02/17/2017 02/16/2017 02/16/2017  WBC 4.0 - 10.5 K/uL 14.8(H) 16.8(H) 14.5(H)  Hemoglobin 12.0 - 15.0 g/dL 11.8(L) 12.1 11.3(L)  Hematocrit 36.0 - 46.0 % 36.7 36.8 33.2(L)  Platelets 150 - 400 K/uL 215 213 185   CMP Latest Ref Rng & Units 02/17/2017 02/16/2017 02/16/2017  Glucose 65 - 99 mg/dL 88 409(W102(H) 119(J113(H)  BUN 6 - 20 mg/dL 19 17 18   Creatinine 0.44 - 1.00 mg/dL 4.780.88 2.950.97 6.210.94  Sodium 135 - 145 mmol/L 134(L) 134(L) 134(L)  Potassium 3.5 - 5.1 mmol/L 3.8 3.9 3.9  Chloride 101 - 111 mmol/L 108 107 107  CO2 22 - 32 mmol/L 18(L) 18(L) 19(L)  Calcium 8.9 - 10.3 mg/dL 7.3(L) 6.5(L) 6.2(LL)  Total Protein 6.5 - 8.1 g/dL 3.0(Q5.8(L) 6.5(H5.8(L) 5.3(L)  Total Bilirubin 0.3 - 1.2 mg/dL 0.3  8.4(O0.2(L) 0.4  Alkaline Phos 38 - 126 U/L 65 70 62  AST 15 - 41 U/L 36 38 29  ALT 14 - 54 U/L 55(H) 51 45   TSH 9.27  (will add Free T3 and Free T4)   Physical exam:  A&O x 3, no acute distress. Pleasant HEENT neg, no thyromegaly Lungs CTA bilat CV RRR, S1S2 normal Abdo soft, non tender, non acute Extr no edema/ tenderness Pelvic n/a  FHT 150s/ min variability/ no decels/ no accels. - no decels and is appropriate for 24-25 wks  (D/w MFM, they recommend deferring BPP at this gestational age and follow FHT as long as its g.age appropriate without decels/ distress) Toco none  Sono 2/5  Dr Sherrie Georgeecker  --> Breech, 534 gm, 1 lb 3 ozat17 % AFI normal, placenta posterior BPP on 2/2 -->  8/8.   A/P: 42 yo, G1 at 24.6 wks with superimposed severe preeclampsia. AM labs stable 1. CHTN with superimposed severe preeclampsia: BPs improved overnight with rest. Asymptomatic  Labetalol 800mg  q8hr (increased to 800mg  today) and Procardia 30mg  XL daily (added 2/5) Daily labs stable, ALT slightly up; Platelets, creatinine, AST wnl.  2. Prematurity- S/p Mag x 48 hrs, BTMZ, NICU  consult. BPP 8/8 on 2/2, Growth sono 2/5, repeat growth sono q2 wks.  3. Anxiety - Buspar 10mg  bid and Xanax 0.5 mg bid/prn 4. Hypothyroid - Levothyroixine and Naturethyroid, TSH elevated 9.27 this AM, need to adjust Synthroid and Naturethyroid  5. GDM A1- BS better since steroid effect worn off. Will space out CBGs q 3 days  6. Obesity 7. AMA 8. Asthma - stable 9. IVF-ICSI pregnancy, unable to obtain fetal echo in-house, normal cardiac images on anatomy sono  Delivery plan: Delivery by C/section, counseled and C/section consent in chart.  Delivery indications include: pulmonary edema, persistent CNS symptoms, uncontrolled severe BPs, nonreassuring fetal status, HELLP syndrome, worsening renal function, abruption, eclampsia. The decision for delivery will be made by the on call MD for the group.  Patient understands and agrees.    V.Natasha Froio, MD

## 2017-02-18 ENCOUNTER — Encounter (HOSPITAL_COMMUNITY): Payer: Self-pay | Admitting: Obstetrics & Gynecology

## 2017-02-18 LAB — CBC
HEMATOCRIT: 32.8 % — AB (ref 36.0–46.0)
HEMOGLOBIN: 10.7 g/dL — AB (ref 12.0–15.0)
MCH: 27.9 pg (ref 26.0–34.0)
MCHC: 32.6 g/dL (ref 30.0–36.0)
MCV: 85.4 fL (ref 78.0–100.0)
Platelets: 204 10*3/uL (ref 150–400)
RBC: 3.84 MIL/uL — AB (ref 3.87–5.11)
RDW: 14.7 % (ref 11.5–15.5)
WBC: 17.1 10*3/uL — AB (ref 4.0–10.5)

## 2017-02-18 LAB — T3, FREE: T3, Free: 2.3 pg/mL (ref 2.0–4.4)

## 2017-02-18 LAB — COMPREHENSIVE METABOLIC PANEL
ALK PHOS: 59 U/L (ref 38–126)
ALT: 45 U/L (ref 14–54)
ANION GAP: 6 (ref 5–15)
AST: 26 U/L (ref 15–41)
Albumin: 2.6 g/dL — ABNORMAL LOW (ref 3.5–5.0)
BUN: 19 mg/dL (ref 6–20)
CHLORIDE: 107 mmol/L (ref 101–111)
CO2: 20 mmol/L — AB (ref 22–32)
Calcium: 6.7 mg/dL — ABNORMAL LOW (ref 8.9–10.3)
Creatinine, Ser: 1.15 mg/dL — ABNORMAL HIGH (ref 0.44–1.00)
GFR calc Af Amer: >60 mL/min (ref >60)
GFR calc non Af Amer: 58 mL/min — ABNORMAL LOW (ref >60)
GLUCOSE: 112 mg/dL — AB (ref 65–99)
POTASSIUM: 5 mmol/L (ref 3.5–5.1)
SODIUM: 133 mmol/L — AB (ref 135–145)
Total Bilirubin: 0.4 mg/dL (ref 0.3–1.2)
Total Protein: 5.3 g/dL — ABNORMAL LOW (ref 6.5–8.1)

## 2017-02-18 LAB — GLUCOSE, CAPILLARY: Glucose-Capillary: 115 mg/dL — ABNORMAL HIGH (ref 65–99)

## 2017-02-18 LAB — MAGNESIUM: Magnesium: 6.6 mg/dL (ref 1.7–2.4)

## 2017-02-18 MED ORDER — PANTOPRAZOLE SODIUM 40 MG IV SOLR
40.0000 mg | INTRAVENOUS | Status: DC
  Administered 2017-02-18: 40 mg via INTRAVENOUS
  Filled 2017-02-18 (×3): qty 40

## 2017-02-18 NOTE — Lactation Note (Signed)
This note was copied from a baby's chart. Lactation Consultation Note: Assisted mom with NICU baby now 3814 hours old with first pumping. Reviewed setup, use and cleaning of pump pieces. Encouraged frequent pumping to promote milk supply. Mom pumped about 10 min then nauseaous and vomiting. Will need review of hand expression. NICU brochure left with mom for review. No questions at present.   Patient Name: Natasha Georges LynchCheryl Bonini OZHYQ'MToday's Date: 02/18/2017 Reason for consult: Initial assessment;Preterm <34wks;NICU baby;1st time breastfeeding;Primapara;Infant < 6lbs   Maternal Data Has patient been taught Hand Expression?: No Does the patient have breastfeeding experience prior to this delivery?: No  Feeding    LATCH Score                   Interventions    Lactation Tools Discussed/Used Pump Review: Setup, frequency, and cleaning Initiated by:: DW Date initiated:: 02/18/17   Consult Status Consult Status: Follow-up Date: 02/19/17 Follow-up type: In-patient    Pamelia HoitWeeks, Arslan Kier D 02/18/2017, 8:02 AM

## 2017-02-18 NOTE — Progress Notes (Signed)
Postop day #1 status post classical cesarean section at 24 weeks 4 preeclampsia with severe features  No n/v, tol po; pain controlled; no sob/cp; no h/a, vision changes, ruq pain; has been to NICU several times and ambulated; +flatus  Patient Vitals for the past 24 hrs:  BP Temp Temp src Pulse Resp SpO2 Weight  02/18/17 1718 120/68 98.1 F (36.7 C) Oral 69 16 95 % -  02/18/17 1125 (!) 151/87 98.3 F (36.8 C) Oral 65 16 97 % -  02/18/17 0850 (!) 143/82 98.2 F (36.8 C) Oral (!) 58 16 97 % -  02/18/17 0700 - - - - 16 95 % -  02/18/17 0623 - - - - - - 266 lb 8 oz (120.9 kg)  02/18/17 0600 - - - - 14 96 % -  02/18/17 0500 - - - - 16 95 % -  02/18/17 0430 (!) 147/92 98.8 F (37.1 C) - 62 18 94 % -  02/18/17 0405 - - - - 16 93 % -  02/18/17 0300 - - - - 14 96 % -  02/18/17 0130 - - - - 15 94 % -  02/18/17 0030 - - - - 16 95 % -  02/17/17 2335 131/83 99.2 F (37.3 C) Oral 69 15 94 % -  02/17/17 2238 135/80 98.9 F (37.2 C) Oral 78 17 96 % -  02/17/17 2130 (!) 164/90 97.9 F (36.6 C) Oral 70 16 96 % -  02/17/17 2129 140/90 - - - - - -  02/17/17 2037 (!) 167/88 - - 74 14 94 % -  02/17/17 2023 (!) 150/91 - - 68 13 - -  02/17/17 2013 (!) 161/95 - - 66 12 95 % -  02/17/17 2000 (!) 154/90 - - 73 18 94 % -  02/17/17 1945 (!) 163/94 - - 78 13 96 % -  02/17/17 1930 (!) 167/94 - - 73 18 96 % -  02/17/17 1915 (!) 152/90 98.9 F (37.2 C) Axillary 73 (!) 21 96 % -  02/17/17 1900 (!) 151/88 - - 70 (!) 21 97 % -  02/17/17 1845 138/85 99 F (37.2 C) Oral 74 18 97 % -  02/17/17 1840 - - - 77 (!) 21 96 % -  02/17/17 1837 (!) 143/88 - - - - - -    Intake/Output Summary (Last 24 hours) at 02/18/2017 1738 Last data filed at 02/18/2017 1726 Gross per 24 hour  Intake 3210 ml  Output 2571 ml  Net 639 ml   Uop: 300ml last 3 hrs, prior 3 hrs 250ml out  CBC Latest Ref Rng & Units 02/18/2017 02/17/2017 02/17/2017  WBC 4.0 - 10.5 K/uL 17.1(H) 13.8(H) 14.8(H)  Hemoglobin 12.0 - 15.0 g/dL 10.7(L) 11.6(L)  11.8(L)  Hematocrit 36.0 - 46.0 % 32.8(L) 35.2(L) 36.7  Platelets 150 - 400 K/uL 204 197 215   CMP Latest Ref Rng & Units 02/18/2017 02/17/2017 02/16/2017  Glucose 65 - 99 mg/dL 098(J112(H) 88 191(Y102(H)  BUN 6 - 20 mg/dL 19 19 17   Creatinine 0.44 - 1.00 mg/dL 7.82(N1.15(H) 5.620.88 1.300.97  Sodium 135 - 145 mmol/L 133(L) 134(L) 134(L)  Potassium 3.5 - 5.1 mmol/L 5.0 3.8 3.9  Chloride 101 - 111 mmol/L 107 108 107  CO2 22 - 32 mmol/L 20(L) 18(L) 18(L)  Calcium 8.9 - 10.3 mg/dL 6.7(L) 7.3(L) 6.5(L)  Total Protein 6.5 - 8.1 g/dL 5.3(L) 5.8(L) 5.8(L)  Total Bilirubin 0.3 - 1.2 mg/dL 0.4 0.3 8.6(V0.2(L)  Alkaline Phos 38 - 126  U/L 59 65 70  AST 15 - 41 U/L 26 36 38  ALT 14 - 54 U/L 45 55(H) 51   A&ox3 rrr ctab Abd: soft, nt, obese LE: +3edema, nt bilat; dtr 2+  A/p: pod 1 s/p classical c/s 1. CHTN with superimposed pre-e: s/p 24 hr mag sulfate at 1800; pt has out today and in last 6 hrs; with elevated creatinine and risk of magnesium toxicity with this and increasing uop, will d/c magsulfate at 24 hrs and contin to monitor closely I/o; will continue to monitor daily weights - up 10lbs since admission ; bps normal to mild range - continue current medication and monitor closely; repeat labs in am  2. Contin current post op care 3. Infant in NICU

## 2017-02-18 NOTE — Progress Notes (Signed)
CRITICAL VALUE ALERT  Critical Value: Mag level 6.6  Date & Time Notied:  02/18/2017 @ 0738  Provider Notified: Dr. Ernestina PennaFogleman   Orders Received/Actions taken: No Orders at this time

## 2017-02-18 NOTE — Plan of Care (Signed)
  Clinical Measurements: Diagnostic test results will improve 02/18/2017 0417 - Progressing by Earnest Rosierfori, Denicia Pagliarulo O, RN   Clinical Measurements: Respiratory complications will improve 02/18/2017 0417 - Progressing by Earnest Rosierfori, Aryn Kops O, RN   Pain Managment: General experience of comfort will improve 02/18/2017 0417 - Progressing by Earnest Rosierfori, Lazaro Isenhower O, RN   Skin Integrity: Risk for impaired skin integrity will decrease 02/18/2017 0417 - Progressing by Earnest Rosierfori, Mahima Hottle O, RN

## 2017-02-18 NOTE — Progress Notes (Signed)
Postop day #1 status post classical cesarean section at 24 weeks 4 preeclampsia with severe features  Subjective: Patient notes no headache, no vision change, no right upper quadrant pain. Patient notes emesis this a.m. Patient reports some increase in abdominal pain. Foley catheter just out and patient has not yet voided. Patient has been to NICU 1. Patient notes no blurry vision but does feel hot.  Objective:  Vitals:   02/18/17 0600 02/18/17 0623 02/18/17 0700 02/18/17 0850  BP:    (!) 143/82  Pulse:    (!) 58  Resp: 14  16 16   Temp:    98.2 F (36.8 C)  TempSrc:    Oral  SpO2: 96%  95% 97%  Weight:  120.9 kg (266 lb 8 oz)    Height:       General: Well appearing but swollen Prevascular: Regular rate and rhythm Pulmonary: Clear to auscultation bilaterally, no crackles or wheezes Abdomen: No right upper quadrant pain, obese, fundus not palpable, appropriate tenderness given recent surgery, no tympany or distention Lower extremity: 3+ lower extremity edema slightly improved from yesterday, 3+ DTR, no clonus, SCDs in place GU: Minimal staining Incision: Dressed, intact, no bleeding  CBC Latest Ref Rng & Units 02/18/2017 02/17/2017 02/17/2017  WBC 4.0 - 10.5 K/uL 17.1(H) 13.8(H) 14.8(H)  Hemoglobin 12.0 - 15.0 g/dL 10.7(L) 11.6(L) 11.8(L)  Hematocrit 36.0 - 46.0 % 32.8(L) 35.2(L) 36.7  Platelets 150 - 400 K/uL 204 197 215   CMP Latest Ref Rng & Units 02/18/2017 02/17/2017 02/16/2017  Glucose 65 - 99 mg/dL 161(W112(H) 88 960(A102(H)  BUN 6 - 20 mg/dL 19 19 17   Creatinine 0.44 - 1.00 mg/dL 5.40(J1.15(H) 8.110.88 9.140.97  Sodium 135 - 145 mmol/L 133(L) 134(L) 134(L)  Potassium 3.5 - 5.1 mmol/L 5.0 3.8 3.9  Chloride 101 - 111 mmol/L 107 108 107  CO2 22 - 32 mmol/L 20(L) 18(L) 18(L)  Calcium 8.9 - 10.3 mg/dL 6.7(L) 7.3(L) 6.5(L)  Total Protein 6.5 - 8.1 g/dL 5.3(L) 5.8(L) 5.8(L)  Total Bilirubin 0.3 - 1.2 mg/dL 0.4 0.3 7.8(G0.2(L)  Alkaline Phos 38 - 126 U/L 59 65 70  AST 15 - 41 U/L 26 36 38  ALT 14 - 54 U/L 45  55(H) 51   Assessment plan: 42 year old chronic hypertensive patient postop day #1 status post primary cesarean section at 24 weeks and 6 days after completion of betamethasone and magnesium and elevated blood pressures unresponsive to max dose therapy. - Preeclampsia. Patient currently on magnesium and will reassess at 24 hours postop by checking ins and outs to decide if patient able to come off her magnesium. Patient is on labetalol 800 mg every 8 hours and Procardia 30 XL daily. Currently blood pressures under adequate control but will continue to reassess especially once magnesium comes off. Creatinine with a slight bump from yesterday as expected given the change in creatinine follows the insults. I discussed with patient my expectation for her creatinine to possibly even elevate one more time tomorrow due to the delayed response from her worsening preeclampsia prior to delivery. Will continue to watch fluids closely. Liver function test and blood parameters are stable. Expect continued drop in hemoglobin as patient resolves her third spacing. Patient may need iron therapy. - Postop. Patient recovering appropriately - Baby in NICU  Lendon ColonelKelly A Lilleigh Hechavarria 02/18/2017 9:14 AM

## 2017-02-18 NOTE — Anesthesia Postprocedure Evaluation (Signed)
Anesthesia Post Note  Patient: Natasha LynchCheryl Caldwell  Procedure(s) Performed: CESAREAN SECTION (N/A )     Patient location during evaluation: Mother Baby Anesthesia Type: Spinal Level of consciousness: awake and alert and oriented Pain management: satisfactory to patient Vital Signs Assessment: post-procedure vital signs reviewed and stable Respiratory status: spontaneous breathing and nonlabored ventilation Cardiovascular status: stable Postop Assessment: no headache, no backache, patient able to bend at knees, no signs of nausea or vomiting and adequate PO intake Anesthetic complications: no    Last Vitals:  Vitals:   02/18/17 0600 02/18/17 0700  BP:    Pulse:    Resp: 14 16  Temp:    SpO2: 96% 95%    Last Pain:  Vitals:   02/18/17 0431  TempSrc:   PainSc: Asleep   Pain Goal:                 Madison HickmanGREGORY,Dimitry Holsworth

## 2017-02-19 LAB — COMPREHENSIVE METABOLIC PANEL
ALBUMIN: 2.4 g/dL — AB (ref 3.5–5.0)
ALT: 31 U/L (ref 14–54)
AST: 16 U/L (ref 15–41)
Alkaline Phosphatase: 59 U/L (ref 38–126)
Anion gap: 6 (ref 5–15)
BUN: 18 mg/dL (ref 6–20)
CO2: 22 mmol/L (ref 22–32)
CREATININE: 1.06 mg/dL — AB (ref 0.44–1.00)
Calcium: 6.7 mg/dL — ABNORMAL LOW (ref 8.9–10.3)
Chloride: 108 mmol/L (ref 101–111)
GFR calc non Af Amer: >60 mL/min (ref >60)
GLUCOSE: 85 mg/dL (ref 65–99)
Potassium: 4 mmol/L (ref 3.5–5.1)
SODIUM: 136 mmol/L (ref 135–145)
Total Bilirubin: 0.4 mg/dL (ref 0.3–1.2)
Total Protein: 5.2 g/dL — ABNORMAL LOW (ref 6.5–8.1)

## 2017-02-19 LAB — CBC WITH DIFFERENTIAL/PLATELET
Basophils Absolute: 0 10*3/uL (ref 0.0–0.1)
Basophils Relative: 0 %
EOS ABS: 0.3 10*3/uL (ref 0.0–0.7)
Eosinophils Relative: 2 %
HCT: 30.4 % — ABNORMAL LOW (ref 36.0–46.0)
HEMOGLOBIN: 9.9 g/dL — AB (ref 12.0–15.0)
LYMPHS ABS: 4 10*3/uL (ref 0.7–4.0)
Lymphocytes Relative: 29 %
MCH: 28 pg (ref 26.0–34.0)
MCHC: 32.6 g/dL (ref 30.0–36.0)
MCV: 86.1 fL (ref 78.0–100.0)
Monocytes Absolute: 0.7 10*3/uL (ref 0.1–1.0)
Monocytes Relative: 5 %
NEUTROS PCT: 64 %
Neutro Abs: 8.9 10*3/uL — ABNORMAL HIGH (ref 1.7–7.7)
Platelets: 192 10*3/uL (ref 150–400)
RBC: 3.53 MIL/uL — AB (ref 3.87–5.11)
RDW: 14.9 % (ref 11.5–15.5)
WBC: 13.8 10*3/uL — AB (ref 4.0–10.5)

## 2017-02-19 MED ORDER — PANTOPRAZOLE SODIUM 40 MG PO TBEC
40.0000 mg | DELAYED_RELEASE_TABLET | Freq: Every day | ORAL | Status: DC
  Administered 2017-02-19 – 2017-02-21 (×3): 40 mg via ORAL
  Filled 2017-02-19 (×2): qty 1

## 2017-02-19 NOTE — Progress Notes (Signed)
Pt had severe range BP this AM.  171/83 at 9:20am and then 164/84 at 9:37am.  Gave Procardia scheduled for 10:00am at 9:40am.  Will recheck BP at 10:40am.  Pt also asking if she may take ibuprofen instead of Tylenol.    Phoned Dr. Amado NashAlmquist with above information. She agrees with plan to recheck BP in one hour and wants to be notified if BP is still in severe range.  She would like to avoid using ibuprofen until BPs are more stable and as long is pain is well managed with current plan.

## 2017-02-19 NOTE — Clinical Social Work Maternal (Addendum)
CLINICAL SOCIAL WORK MATERNAL/CHILD NOTE  Patient Details  Name: Natasha Caldwell MRN: [id number] Date of Birth: 07/22/1975  Date:  02/19/2017  Clinical Social Worker Initiating Note:  Laurey Arrow Date/Time: Initiated:  02/19/17/1037     Child's Name:  Natasha Caldwell   Biological Parents:  Mother, Father   Need for Interpreter:  None   Reason for Referral:  Behavioral Health Concerns, Parental Support of Premature Babies < 32 weeks/or Critically Ill babies   Address:  [place] [address]    Phone number:  [phone] (home)     Additional phone number: FOB's number is [phone]   Household Members/Support Persons (HM/SP):   Household Member/Support Person 1   HM/SP Name Relationship DOB or Age  HM/SP -1 Natasha Caldwell FOB/Husband 03/17/1980  HM/SP -2        HM/SP -3        HM/SP -4        HM/SP -5        HM/SP -6        HM/SP -7        HM/SP -8          Natural Supports (not living in the home):  Immediate Family, Neighbors, Extended Family, Friends, Parent(FOB's family will also provide support. )   Professional Supports: None   Employment: Full-time   Type of Work: Professor at [employer]   Education:  Pensions consultant   Homebound arranged:    Museum/gallery curator Resources:  Multimedia programmer   Other Resources:      Cultural/Religious Considerations Which May Impact Care:  None Reported  Strengths:  Ability to meet basic needs , Psychotropic Medications   Psychotropic Medications:  Xanax, Buspar      Pediatrician:       Pediatrician List:   [place]      Pediatrician Fax Number:    Risk Factors/Current Problems:  Mental Health Concerns    Cognitive State:  Alert , Able to Concentrate , Insightful , Linear Thinking    Mood/Affect:  Interested , Relaxed , Comfortable , Calm , Tearful    CSW Assessment: CSW met with  MOB in room 310 to complete an assessment for NICU admission. CSW explained CSW's role and MOB gave CSW permission to complete the assessment while FOB was present. Both parents were supportive of one another and expressed excitement about being new parents.  FOB shared that FOB has 3 sons (11,8, and 6) and Natasha Caldwell is MOB's first child and their only daughter. MOB and FOB were easy to engage, polite, and receptive to meeting with CSW.  CSW asked MOB about her thoughts and feelings regarding NICU admission.  MOB communicated, "I have been very emotional and since being here I been taking Buspar to help reduce my anxiety."  MOB and FOB shared feelings of being sad and concerned about infant's health due to prematurity.  CSW validated and normalized their thoughts and feelings. CSW offered resources for outpatient counseling and the couple declined, however agreed to reach out to CSW if resource is needed in the future. CSW provided education regarding the baby blues period vs. perinatal mood disorders, discussed treatment and gave resources for mental health follow up if concerns arise.  CSW recommends self-evaluation during the postpartum time period using the New Mom Checklist from Postpartum Progress and encouraged MOB to contact  a medical professional if symptoms are noted at any time.     CSW discussed SSI in which baby qualifies for due to low birth weight and gestational weeks. Family interested in applying and CSW explained process and steps. CSW will follow up with MOB in getting information about SSI.    CSW reviewed NICU visitation policy and daily NICU rounds.   The family denied having any psychosocial stressors and barriers to visiting with infant.    CSW will continue to provide resources and supports to family while infant remains in NICU.    CSW Plan/Description:  Psychosocial Support and Ongoing Assessment of Needs, Perinatal Mood and Anxiety Disorder (PMADs) Education, Other  Patient/Family Education, [employer] Income (SSI) Information   Laurey Arrow, MSW, LCSW Clinical Social Work [phone]   Dimple Nanas, LCSW 02/19/2017, 2:00 PM

## 2017-02-19 NOTE — Progress Notes (Signed)
Postop day #2 status post classical cesarean section at 24 weeks 4 preeclampsia with severe features  No h/a, vision changes, ruq pain; no sob/cp, no n/v; pain controlled, ambulating; +flatus   Patient Vitals for the past 24 hrs:  BP Temp Temp src Pulse Resp SpO2  02/19/17 0535 (!) 147/75 98.6 F (37 C) Oral 72 18 96 %  02/19/17 0115 137/80 98.4 F (36.9 C) Oral 76 18 96 %  02/18/17 2000 (!) 142/82 97.6 F (36.4 C) Oral 80 18 97 %  02/18/17 1718 120/68 98.1 F (36.7 C) Oral 69 16 95 %  02/18/17 1125 (!) 151/87 98.3 F (36.8 C) Oral 65 16 97 %  02/18/17 0850 (!) 143/82 98.2 F (36.8 C) Oral (!) 58 16 97 %    Intake/Output Summary (Last 24 hours) at 02/19/2017 0847 Last data filed at 02/19/2017 0500 Gross per 24 hour  Intake 3273.75 ml  Output 3850 ml  Net -576.25 ml     CBC Latest Ref Rng & Units 02/19/2017 02/18/2017 02/17/2017  WBC 4.0 - 10.5 K/uL 13.8(H) 17.1(H) 13.8(H)  Hemoglobin 12.0 - 15.0 g/dL 9.9(L) 10.7(L) 11.6(L)  Hematocrit 36.0 - 46.0 % 30.4(L) 32.8(L) 35.2(L)  Platelets 150 - 400 K/uL 192 204 197   CMP Latest Ref Rng & Units 02/19/2017 02/18/2017 02/17/2017  Glucose 65 - 99 mg/dL 85 112(H) 88  BUN 6 - 20 mg/dL 18 19 19   Creatinine 0.44 - 1.00 mg/dL 1.06(H) 1.15(H) 0.88  Sodium 135 - 145 mmol/L 136 133(L) 134(L)  Potassium 3.5 - 5.1 mmol/L 4.0 5.0 3.8  Chloride 101 - 111 mmol/L 108 107 108  CO2 22 - 32 mmol/L 22 20(L) 18(L)  Calcium 8.9 - 10.3 mg/dL 6.7(L) 6.7(L) 7.3(L)  Total Protein 6.5 - 8.1 g/dL 5.2(L) 5.3(L) 5.8(L)  Total Bilirubin 0.3 - 1.2 mg/dL 0.4 0.4 0.3  Alkaline Phos 38 - 126 U/L 59 59 65  AST 15 - 41 U/L 16 26 36  ALT 14 - 54 U/L 31 45 55(H)   A/p: pod 2 s/p classical c/s at 24.4  1. CHTN with superimposed pre-e: improving; bps normal to mild range - contin current labetalol 858m q 8/procardia 30XL q day  and contin to follow; s/p 24 hrs mag sulfate (d/c'd 1800 last night); increased diuresis and will continue to monitor for continued diuresis; today's  weight is pending; lfts stable and wnl 2. Elevated creatinine: improving but plan repeat labs in 24 hrs (tomorrow am) and anticipate trending to normal 3. Chronic anemia with acute chage - asymptomatic, plan iron q day pp; will repeat in am to confirm stable at current level 4. Hypothyroid - contin current meds 5. Infant in NICU - "doing ok" 6. Severe anxiety  - stable and doing well 7. gdma1 - f/u pp 8. Asthma - stable 9. Rh pos 10. Post op - contin care; post op milestones met and contin to follow; increase ambulation today

## 2017-02-19 NOTE — Lactation Note (Signed)
This note was copied from a baby's chart. Lactation Consultation Note  Patient Name: Natasha Caldwell ZOXWR'UToday's Date: 02/19/2017 Reason for consult: Follow-up assessment;Preterm <34wks;NICU baby;1st time breastfeeding;Primapara;Other (Comment)(per mom has pumped with DEBP and hand expressed x 6 in the last 24 hours with drops )  Baby is 8743 hours old , NICU,  Today mom was taken off MagSo4, per RN , B/P has been elevated / and B/P meds  LC reviewed supply and demand and praised mom for her efforts pumping and hand expressing.  LC reassured mom it all can be a slow process and eventually the brain will let more milk down.  LC encouraged mom to increase pumping to 8 X's in 24 hours.  LC explained power pumping once a day ( 10 mins on/ off over 60 mins , or 20 mins on/ off over 60 mins)     Maternal Data Has patient been taught Hand Expression?: Yes(mom had been shown by the RN and dad just took drops down to NICU )  Feeding    LATCH Score                   Interventions Interventions: Breast feeding basics reviewed;DEBP  Lactation Tools Discussed/Used Tools: Pump(per mom has pumped x 6 in the last 24 hours with out results , drops with hand express ) Breast pump type: Double-Electric Breast Pump WIC Program: No Pump Review: Milk Storage   Consult Status Consult Status: Follow-up Date: 02/20/17 Follow-up type: In-patient    Natasha Caldwell 02/19/2017, 1:23 PM

## 2017-02-20 LAB — COMPREHENSIVE METABOLIC PANEL
ALBUMIN: 2.6 g/dL — AB (ref 3.5–5.0)
ALT: 30 U/L (ref 14–54)
AST: 24 U/L (ref 15–41)
Alkaline Phosphatase: 55 U/L (ref 38–126)
Anion gap: 7 (ref 5–15)
BUN: 12 mg/dL (ref 6–20)
CHLORIDE: 108 mmol/L (ref 101–111)
CO2: 21 mmol/L — AB (ref 22–32)
CREATININE: 0.88 mg/dL (ref 0.44–1.00)
Calcium: 8.1 mg/dL — ABNORMAL LOW (ref 8.9–10.3)
GFR calc Af Amer: >60 mL/min (ref >60)
GFR calc non Af Amer: >60 mL/min (ref >60)
Glucose, Bld: 80 mg/dL (ref 65–99)
Potassium: 4 mmol/L (ref 3.5–5.1)
SODIUM: 136 mmol/L (ref 135–145)
Total Bilirubin: 0.4 mg/dL (ref 0.3–1.2)
Total Protein: 5.7 g/dL — ABNORMAL LOW (ref 6.5–8.1)

## 2017-02-20 LAB — CBC WITH DIFFERENTIAL/PLATELET
BASOS ABS: 0 10*3/uL (ref 0.0–0.1)
Basophils Relative: 0 %
EOS ABS: 0.3 10*3/uL (ref 0.0–0.7)
EOS PCT: 3 %
HCT: 31.6 % — ABNORMAL LOW (ref 36.0–46.0)
Hemoglobin: 10.3 g/dL — ABNORMAL LOW (ref 12.0–15.0)
Lymphocytes Relative: 30 %
Lymphs Abs: 3.7 10*3/uL (ref 0.7–4.0)
MCH: 28.1 pg (ref 26.0–34.0)
MCHC: 32.6 g/dL (ref 30.0–36.0)
MCV: 86.1 fL (ref 78.0–100.0)
Monocytes Absolute: 0.4 10*3/uL (ref 0.1–1.0)
Monocytes Relative: 4 %
Neutro Abs: 7.9 10*3/uL — ABNORMAL HIGH (ref 1.7–7.7)
Neutrophils Relative %: 63 %
PLATELETS: 216 10*3/uL (ref 150–400)
RBC: 3.67 MIL/uL — AB (ref 3.87–5.11)
RDW: 14.9 % (ref 11.5–15.5)
WBC: 12.4 10*3/uL — AB (ref 4.0–10.5)

## 2017-02-20 MED ORDER — IBUPROFEN 800 MG PO TABS
800.0000 mg | ORAL_TABLET | Freq: Three times a day (TID) | ORAL | Status: DC | PRN
  Administered 2017-02-20 – 2017-02-21 (×2): 800 mg via ORAL
  Filled 2017-02-20 (×2): qty 1

## 2017-02-20 MED ORDER — ONDANSETRON 4 MG PO TBDP
4.0000 mg | ORAL_TABLET | Freq: Four times a day (QID) | ORAL | Status: DC | PRN
  Administered 2017-02-20 – 2017-02-21 (×2): 4 mg via ORAL
  Filled 2017-02-20 (×3): qty 1

## 2017-02-20 MED ORDER — IBUPROFEN 100 MG/5ML PO SUSP
800.0000 mg | Freq: Three times a day (TID) | ORAL | Status: DC | PRN
  Filled 2017-02-20: qty 40

## 2017-02-20 MED ORDER — NIFEDIPINE ER OSMOTIC RELEASE 30 MG PO TB24
30.0000 mg | ORAL_TABLET | Freq: Two times a day (BID) | ORAL | Status: DC
  Administered 2017-02-20 – 2017-02-21 (×2): 30 mg via ORAL
  Filled 2017-02-20 (×2): qty 1

## 2017-02-20 MED ORDER — IBUPROFEN 100 MG PO CHEW
800.0000 mg | CHEWABLE_TABLET | Freq: Three times a day (TID) | ORAL | Status: DC | PRN
  Filled 2017-02-20: qty 8

## 2017-02-20 NOTE — Progress Notes (Signed)
Patient states she has not been voiding in the "hat" today and nurse encouraged her to start.

## 2017-02-20 NOTE — Progress Notes (Addendum)
Subjective: POD# 3 S/P classical cesarean section at 24 weeks 4 - preeclampsia with severe features   Information for the patient's newborn:  Pilar JarvisHillis, Girl Abrianna [[id number]]  female - stable in NICU  Reports feeling well, nausea improved after Zofran ODT Feeding: pumping regualrly Patient reports tolerating PO.  Breast symptoms:+ colostrum Pain controlled with PO meds Denies HA/SOB/C/P/N/V/dizziness. Flatus present, + BM. She reports vaginal bleeding as normal, without clots.  She is ambulating, urinating without difficulty.     Objective:   VS:    Vitals:   02/20/17 0705 02/20/17 0745 02/20/17 0815 02/20/17 0930  BP: (!) 169/91 (!) 162/88 (!) 167/87 (!) 155/87  Pulse: 71 73 75 79  Resp:   20   Temp:   98.1 F (36.7 C)   TempSrc:   Oral   SpO2:   96%   Weight:      Height:          Intake/Output Summary (Last 24 hours) at 02/20/2017 1110 Last data filed at 02/19/2017 1644 Gross per 24 hour  Intake -  Output 2900 ml  Net -2900 ml        Recent Labs    02/19/17 0500 02/20/17 0509  WBC 13.8* 12.4*  HGB 9.9* 10.3*  HCT 30.4* 31.6*  PLT 192 216   CMP Latest Ref Rng & Units 02/20/2017 02/19/2017 02/18/2017  Glucose 65 - 99 mg/dL 80 85 409(W112(H)  BUN 6 - 20 mg/dL 12 18 19   Creatinine 0.44 - 1.00 mg/dL 1.190.88 1.47(W1.06(H) 2.95(A1.15(H)  Sodium 135 - 145 mmol/L 136 136 133(L)  Potassium 3.5 - 5.1 mmol/L 4.0 4.0 5.0  Chloride 101 - 111 mmol/L 108 108 107  CO2 22 - 32 mmol/L 21(L) 22 20(L)  Calcium 8.9 - 10.3 mg/dL 8.1(L) 6.7(L) 6.7(L)  Total Protein 6.5 - 8.1 g/dL 2.1(H5.7(L) 5.2(L) 5.3(L)  Total Bilirubin 0.3 - 1.2 mg/dL 0.4 0.4 0.4  Alkaline Phos 38 - 126 U/L 55 59 59  AST 15 - 41 U/L 24 16 26   ALT 14 - 54 U/L 30 31 45    Blood type: --/--/O POS (02/05 1132)  Rubella: Immune (10/25 0000)     Physical Exam:  General: alert, cooperative and no distress Abdomen: soft, nontender, normal bowel sounds Incision: dressing soaked after shower, will change, incision healing well.  Uterine  Fundus: firm, below umbilicus, nontender Lochia: minimal Ext: edema +2 pedal and pretibial      Assessment/Plan: 42 y.o.   POD# 3. [id number]                  Active Problems:   Severe preeclampsia   Hypothyroidism   Generalized anxiety disorder   Obesity   Asthma, chronic   Pregnancy resulting from in vitro fertilization   GDM, class A1   Chronic hypertension with superimposed preeclampsia  CHTN with superimposed PEC resolving, labs continuing to improve, however, remains mild range with occasional excursion into severe range. IV labetalol given at 0710. Will continue current regimen - labetalol 800mg  q 8/procardia 30XL q day  and continue to follow; s/p 24 hrs mag sulfate (d/c'd 1800 on 9/7) consider increase ProcardiaXL 60 mg daily if repeated severe range BP today.  Strict I&O inpatient continued TED hose Anxiety - stable Asthma - stable GDM A1 - f/u PP Hypothyroid - continue current regimen Routine post-op care  Neta Mendsaniela C Paul, CNM, MSN 02/20/2017, 11:10 AM

## 2017-02-20 NOTE — Lactation Note (Signed)
This note was copied from a baby's chart. Lactation Consultation Note; Mom reports she pumped 6 times yesterday. Is obtaining small amounts of Colostrum. Pleased to be seeing some Colostrum now. Encouraged to pump 8 times/24 hours. Has called insurance company and her pump should be here by tomorrow. No questions at present,. Encouragement given.  Patient Name: Girl Georges LynchCheryl Gries QIONG'EToday's Date: 02/20/2017 Reason for consult: Follow-up assessment;Preterm <34wks;NICU baby;1st time breastfeeding   Maternal Data    Feeding    LATCH Score                   Interventions    Lactation Tools Discussed/Used     Consult Status Consult Status: Follow-up Date: 02/21/17 Follow-up type: In-patient    Pamelia HoitWeeks, Madhuri Vacca D 02/20/2017, 10:35 AM

## 2017-02-20 NOTE — Progress Notes (Signed)
POD #3 status post classical cesarean section at 24 weeks 4 preeclampsia with severe features  No h/a, vision changes, ruq pain; no sob/cp, no n/v; pain controlled, ambulating; +flatus   Patient Vitals for the past 24 hrs:  BP Temp Temp src Pulse Resp SpO2 Weight  02/20/17 0930 (!) 155/87 - - 79 - - -  02/20/17 0815 (!) 167/87 98.1 F (36.7 C) Oral 75 20 96 % -  02/20/17 0745 (!) 162/88 - - 73 - - -  02/20/17 0705 (!) 169/91 - - 71 - - -  02/20/17 0647 (!) 172/90 - - 75 20 95 % -  02/20/17 0606 (!) 179/93 99.3 F (37.4 C) Oral 77 18 - 115.7 kg (255 lb)  02/20/17 0605 - - - - - 95 % -  02/20/17 0009 (!) 159/82 98.7 F (37.1 C) Oral 77 18 97 % -  02/19/17 2020 (!) 158/83 98.3 F (36.8 C) Oral 70 18 97 % -  02/19/17 1644 (!) 154/80 98.7 F (37.1 C) Oral 77 18 96 % -  02/19/17 1211 (!) 148/80 98.2 F (36.8 C) Oral 81 20 98 % -    Intake/Output Summary (Last 24 hours) at 02/20/2017 1147 Last data filed at 02/19/2017 1644 Gross per 24 hour  Intake -  Output 2900 ml  Net -2900 ml     CBC Latest Ref Rng & Units 02/20/2017 02/19/2017 02/18/2017  WBC 4.0 - 10.5 K/uL 12.4(H) 13.8(H) 17.1(H)  Hemoglobin 12.0 - 15.0 g/dL 10.3(L) 9.9(L) 10.7(L)  Hematocrit 36.0 - 46.0 % 31.6(L) 30.4(L) 32.8(L)  Platelets 150 - 400 K/uL 216 192 204   CMP Latest Ref Rng & Units 02/20/2017 02/19/2017 02/18/2017  Glucose 65 - 99 mg/dL 80 85 161(W112(H)  BUN 6 - 20 mg/dL 12 18 19   Creatinine 0.44 - 1.00 mg/dL 9.600.88 4.54(U1.06(H) 9.81(X1.15(H)  Sodium 135 - 145 mmol/L 136 136 133(L)  Potassium 3.5 - 5.1 mmol/L 4.0 4.0 5.0  Chloride 101 - 111 mmol/L 108 108 107  CO2 22 - 32 mmol/L 21(L) 22 20(L)  Calcium 8.9 - 10.3 mg/dL 8.1(L) 6.7(L) 6.7(L)  Total Protein 6.5 - 8.1 g/dL 9.1(Y5.7(L) 5.2(L) 5.3(L)  Total Bilirubin 0.3 - 1.2 mg/dL 0.4 0.4 0.4  Alkaline Phos 38 - 126 U/L 55 59 59  AST 15 - 41 U/L 24 16 26   ALT 14 - 54 U/L 30 31 45   A/p: pod 2 s/p classical c/s at 24.4  1. CHTN with superimposed PEC now PP: improving; bps normal to mild  range , occ severe BP contin current labetalol 800mg  q 8 and inc procardia 30XL to bid. Diuresis noted. Labs stable. 2. Elevated creatinine: improved 3. Chronic anemia - asymptomatic, plan iron q day pp;  4. Hypothyroid - contin current meds 5. Severe anxiety  - stable and doing well 7. gdma1 - f/u pp 8. Asthma - stable

## 2017-02-21 MED ORDER — NIFEDIPINE ER 30 MG PO TB24: 30.0000 mg | ORAL_TABLET | Freq: Two times a day (BID) | ORAL | 0 refills | Status: AC

## 2017-02-21 MED ORDER — COCONUT OIL OIL: 1.0000 "application " | TOPICAL_OIL | 0 refills | Status: AC | PRN

## 2017-02-21 MED ORDER — SENNOSIDES-DOCUSATE SODIUM 8.6-50 MG PO TABS: 2.0000 | ORAL_TABLET | ORAL | Status: AC

## 2017-02-21 MED ORDER — LEVOTHYROXINE SODIUM 150 MCG PO TABS: 150.0000 ug | ORAL_TABLET | Freq: Every day | ORAL | 1 refills | Status: AC

## 2017-02-21 MED ORDER — BUSPIRONE HCL 10 MG PO TABS: 10.0000 mg | ORAL_TABLET | Freq: Two times a day (BID) | ORAL | 0 refills | Status: AC

## 2017-02-21 MED ORDER — IBUPROFEN 800 MG PO TABS: 800.0000 mg | ORAL_TABLET | Freq: Three times a day (TID) | ORAL | 0 refills | Status: AC | PRN

## 2017-02-21 MED ORDER — ALPRAZOLAM 0.5 MG PO TABS: 0.5000 mg | ORAL_TABLET | Freq: Two times a day (BID) | ORAL | 0 refills | Status: AC | PRN

## 2017-02-21 MED ORDER — LABETALOL HCL 200 MG PO TABS: 800.0000 mg | ORAL_TABLET | Freq: Three times a day (TID) | ORAL | 0 refills | Status: AC

## 2017-02-21 MED ORDER — OXYCODONE HCL 5 MG PO TABS: 5.0000 mg | ORAL_TABLET | ORAL | 0 refills | Status: AC | PRN

## 2017-02-21 NOTE — Progress Notes (Signed)
Subjective: POD# 4 S/P classical cesarean section at 24 weeks 4 - preeclampsia with severe features   Information for the patient's newborn:  Pilar JarvisHillis, Girl Rechelle [[id number]][[id number]]  female - stable in NICU   Reports feeling well, desires DC home Feeding: pumping, + colostrum, milk coming in Pain controlled with PO meds Denies HA/SOB/C/P/N/V/dizziness. Flatus present, + BM. She reports vaginal bleeding as normal, without clots.  She is ambulating, urinating without difficulty.     Objective:   VS:    Vitals:   02/21/17 0500 02/21/17 0555 02/21/17 0900 02/21/17 1234  BP:  129/76 (!) 150/79 138/65  Pulse:  81 88 79  Resp:   18 18  Temp:   98.1 F (36.7 C)   TempSrc:   Oral   SpO2:   98% 98%  Weight: 110.2 kg (243 lb 0.3 oz)     Height:          Intake/Output Summary (Last 24 hours) at 02/21/2017 1301 Last data filed at 02/20/2017 1800 Gross per 24 hour  Intake 1320 ml  Output 1000 ml  Net 320 ml        Recent Labs    02/19/17 0500 02/20/17 0509  WBC 13.8* 12.4*  HGB 9.9* 10.3*  HCT 30.4* 31.6*  PLT 192 216     Blood type: --/--/O POS (02/05 1132)  Rubella: Immune (10/25 0000)     Physical Exam:  General: alert, cooperative and no distress Abdomen: soft, nontender, normal bowel sounds Incision: clean, dry and intact Uterine Fundus: firm, below umbilicus, nontender Lochia: minimal Ext: edema +1 pedal, well reduced from yesterday  -12 lbs last 24 hours. -22 lbs since delivery    Assessment/Plan: 42 y.o.   POD# 4. [id number]                  Active Problems:   Severe preeclampsia   Hypothyroidism   Generalized anxiety disorder   Obesity   Asthma, chronic   Pregnancy resulting from in vitro fertilization   GDM, class A1   Chronic hypertension with superimposed preeclampsia  CHTN with superimposed PEC resolving        - BP normal to mild range     - great diuresis, lost 12 lbs in last 24 hours,  Anxiety - stable Asthma - stable GDM A1 - f/u  PP Hypothyroid - continue current regimen Chronic anemia - asymptomatic, plan iron q day pp  Newborn stable, remains in NICU Continue labetalol and procardia at current doses PP PEC exacerbation precautions discussed.              DC home today w/ instructions  [facility] and PRN    Lenoard AdenAAVON,Hasina Kreager J, CNM, MSN 02/21/2017, 1:01 PM

## 2017-02-21 NOTE — Discharge Summary (Signed)
OB Discharge Summary     Patient Name: Natasha Caldwell DOB: 10-23-1975 MRN: [id number]  Date of admission: 02/13/2017 Delivering MD: MODY, VAISHALI   Date of discharge: 02/21/2017  Admitting diagnosis: 25 WKS, UNABLE TO KEEP LIQUID OR FOOD DOWN Intrauterine pregnancy: [redacted]w[redacted]d     Secondary diagnosis:  Active Problems:   Severe preeclampsia   Hypothyroidism   Generalized anxiety disorder   Obesity   Asthma, chronic   Pregnancy resulting from in vitro fertilization   GDM, class A1   Chronic hypertension with superimposed preeclampsia      Discharge diagnosis: Preterm Pregnancy Delivered, Preeclampsia (severe), GDM A1, Anemia and hypothyroid, anxiety                                    Hospital course:  Sceduled C/S   42 y.o. yo G1P0101 at [redacted]w[redacted]d was admitted to the hospital 02/13/2017 for severe range blood pressures uncontrolled by antihypertensives, was treated with 48 hours of steroids for fetal lung maturation and magnesium sulfate prophylaxis for seizures then underwent cesarean section with the following indication:preeclammpsia with uncontrolled hypertension.  Membrane Rupture Time/Date: 5:37 PM ,02/17/2017   Patient delivered a Viable infant.02/17/2017  Details of operation can be found in separate operative note.  Patient had a complicated postpartum course with severe range blood pressure persisting through the first 3 days post-op, and elevated creatinine levels which normalized by 3rd pos-op day. Magnesium sulfate continued x 24 hour postpartum. Patient diuresed well and lost 22 lbs in 4 days after delivery. All other preeclamptic labs stable. She is ambulating, tolerating a regular diet, passing flatus, and urinating well. Patient is discharged home in stable condition on  02/21/17         Physical exam  Vitals:   02/21/17 0400 02/21/17 0500 02/21/17 0555 02/21/17 0900  BP: 126/73  129/76 (!) 150/79  Pulse: 89  81 88  Resp: 20   18  Temp: 98.5 F (36.9 C)   98.1 F (36.7 C)   TempSrc: Oral   Oral  SpO2: 96%   98%  Weight:  110.2 kg (243 lb 0.3 oz)    Height:       General: alert, cooperative and no distress Lochia: appropriate Uterine Fundus: firm Incision: Dressing is clean, dry, and intact DVT Evaluation: Calf/Ankle edema is present Labs: Lab Results  Component Value Date   WBC 12.4 (H) 02/20/2017   HGB 10.3 (L) 02/20/2017   HCT 31.6 (L) 02/20/2017   MCV 86.1 02/20/2017   PLT 216 02/20/2017   CMP Latest Ref Rng & Units 02/20/2017  Glucose 65 - 99 mg/dL 80  BUN 6 - 20 mg/dL 12  Creatinine 7.82 - 9.56 mg/dL 2.13  Sodium 086 - 578 mmol/L 136  Potassium 3.5 - 5.1 mmol/L 4.0  Chloride 101 - 111 mmol/L 108  CO2 22 - 32 mmol/L 21(L)  Calcium 8.9 - 10.3 mg/dL 8.1(L)  Total Protein 6.5 - 8.1 g/dL 4.6(N)  Total Bilirubin 0.3 - 1.2 mg/dL 0.4  Alkaline Phos 38 - 126 U/L 55  AST 15 - 41 U/L 24  ALT 14 - 54 U/L 30    Discharge instruction: per After Visit Summary and "Baby and Me Booklet".  After visit meds:  Allergies as of 02/21/2017      Reactions   Bee Venom Shortness Of Breath, Swelling   Eggs Or Egg-derived Products Hives, Nausea And Vomiting   Monistat [  miconazole] Hives   Penicillins Hives   Has patient had a PCN reaction causing immediate rash, facial/tongue/throat swelling, SOB or lightheadedness with hypotension: No Has patient had a PCN reaction causing severe rash involving mucus membranes or skin necrosis: No Has patient had a PCN reaction that required hospitalization: No Has patient had a PCN reaction occurring within the last 10 years: No If all of the above answers are "NO", then may proceed with Cephalosporin use.   Shellfish Allergy Hives, Swelling   Sulfa Antibiotics Hives   Tetracyclines & Related Hives   Adhesive [tape] Rash   Nickel Rash      Medication List    STOP taking these medications   acetaminophen-codeine 300-30 MG tablet Commonly known as:  TYLENOL #3   diphenhydrAMINE 25 MG tablet Commonly known as:   BENADRYL     TAKE these medications   acetaminophen 325 MG tablet Commonly known as:  TYLENOL Take 650 mg by mouth every 6 (six) hours as needed for mild pain.   albuterol 108 (90 Base) MCG/ACT inhaler Commonly known as:  PROVENTIL HFA;VENTOLIN HFA Inhale 1-2 puffs into the lungs every 6 (six) hours as needed for wheezing or shortness of breath.   ALLERGY EYE 0.025-0.3 % ophthalmic solution Generic drug:  naphazoline-pheniramine Place 1 drop into both eyes 4 (four) times daily as needed for eye irritation or allergies.   ALPRAZolam 0.5 MG tablet Commonly known as:  XANAX Take 1 tablet (0.5 mg total) by mouth 2 (two) times daily as needed for anxiety.   aspirin 81 MG chewable tablet Chew by mouth daily.   busPIRone 10 MG tablet Commonly known as:  BUSPAR Take 1 tablet (10 mg total) by mouth 2 (two) times daily.   calcium carbonate 500 MG chewable tablet Commonly known as:  TUMS - dosed in mg elemental calcium Chew 1 tablet by mouth 2 (two) times daily as needed for indigestion or heartburn.   cetirizine 10 MG tablet Commonly known as:  ZYRTEC Take 10 mg by mouth daily.   cholecalciferol 1000 units tablet Commonly known as:  VITAMIN D Take 1,000 Units by mouth daily.   coconut oil Oil Apply 1 application topically as needed.   EPIPEN 2-PAK 0.3 mg/0.3 mL Soaj injection Generic drug:  EPINEPHrine Inject 0.3 mg into the muscle once.   fluticasone 50 MCG/ACT nasal spray Commonly known as:  FLONASE Place 1 spray into both nostrils daily.   ibuprofen 800 MG tablet Commonly known as:  ADVIL,MOTRIN Take 1 tablet (800 mg total) by mouth every 8 (eight) hours as needed for moderate pain or cramping.   labetalol 200 MG tablet Commonly known as:  NORMODYNE Take 4 tablets (800 mg total) by mouth every 8 (eight) hours. What changed:    how much to take  when to take this   levothyroxine 100 MCG tablet Commonly known as:  SYNTHROID, LEVOTHROID Take 100 mcg by mouth  daily before breakfast. What changed:  Another medication with the same name was added. Make sure you understand how and when to take each.   levothyroxine 150 MCG tablet Commonly known as:  SYNTHROID, LEVOTHROID Take 1 tablet (150 mcg total) by mouth daily before breakfast. Start taking on:  02/22/2017 What changed:  You were already taking a medication with the same name, and this prescription was added. Make sure you understand how and when to take each.   Magnesium Malate 1250 (141.7 Mg) MG Tabs Take 1 tablet by mouth at bedtime.   NATURE-THROID  130 MG tablet Generic drug:  thyroid Take 130 mg by mouth daily.   NIFEdipine 30 MG 24 hr tablet Commonly known as:  PROCARDIA-XL/ADALAT CC Take 1 tablet (30 mg total) by mouth every 12 (twelve) hours.   ondansetron 4 MG tablet Commonly known as:  ZOFRAN Take 4 mg by mouth every 8 (eight) hours as needed.   oxyCODONE 5 MG immediate release tablet Commonly known as:  Oxy IR/ROXICODONE Take 1 tablet (5 mg total) by mouth every 4 (four) hours as needed (pain scale 4-7).   prenatal multivitamin Tabs tablet Take 1 tablet by mouth daily at 12 noon.   senna-docusate 8.6-50 MG tablet Commonly known as:  Senokot-S Take 2 tablets by mouth daily. Start taking on:  02/22/2017       Diet: routine diet  Activity: Advance as tolerated. Pelvic rest for 6 weeks.   Outpatient follow up: 1 week in office  Postpartum contraception: Not Discussed  Newborn Data: Live born female  Birth Weight: 1 lb 4.5 oz (580 g) APGAR: 8, 10  Newborn Delivery   Birth date/time:  02/17/2017 17:39:00 Delivery type:  C-Section, Low Transverse C-section categorization:  Primary     Baby Feeding: no feeds yet, mother pumping and storing Disposition:NICU   02/21/2017 Neta Mendsaniela C Vernisha Bacote, CNM

## 2017-02-21 NOTE — Progress Notes (Signed)
Discharge instructions reviewed with pt. Post-op care, signs and symptoms to report, and medications gone over with pt. Pt verbalized understanding. Pt has no questions or concerns at this time. Pt being walked down in stable condition.

## 2017-02-21 NOTE — Progress Notes (Signed)
Subjective: POD# 4 S/P classical cesarean section at 24 weeks 4 - preeclampsia with severe features   Information for the patient's newborn:  Pilar JarvisHillis, Girl Lile [[id number]][[id number]]  female - stable in NICU   Reports feeling well, desires DC home Feeding: pumping, + colostrum, milk coming in Pain controlled with PO meds Denies HA/SOB/C/P/N/V/dizziness. Flatus present, + BM. She reports vaginal bleeding as normal, without clots.  She is ambulating, urinating without difficulty.     Objective:   VS:    Vitals:   02/21/17 0400 02/21/17 0500 02/21/17 0555 02/21/17 0900  BP: 126/73  129/76 (!) 150/79  Pulse: 89  81 88  Resp: 20   18  Temp: 98.5 F (36.9 C)   98.1 F (36.7 C)  TempSrc: Oral   Oral  SpO2: 96%   98%  Weight:  110.2 kg (243 lb 0.3 oz)    Height:          Intake/Output Summary (Last 24 hours) at 02/21/2017 1205 Last data filed at 02/20/2017 1800 Gross per 24 hour  Intake 1320 ml  Output 1000 ml  Net 320 ml        Recent Labs    02/19/17 0500 02/20/17 0509  WBC 13.8* 12.4*  HGB 9.9* 10.3*  HCT 30.4* 31.6*  PLT 192 216     Blood type: --/--/O POS (02/05 1132)  Rubella: Immune (10/25 0000)     Physical Exam:  General: alert, cooperative and no distress Abdomen: soft, nontender, normal bowel sounds Incision: clean, dry and intact Uterine Fundus: firm, below umbilicus, nontender Lochia: minimal Ext: edema +1 pedal, well reduced from yesterday  -12 lbs last 24 hours. -22 lbs since delivery    Assessment/Plan: 42 y.o.   POD# 4. [id number]                  Active Problems:   Severe preeclampsia   Hypothyroidism   Generalized anxiety disorder   Obesity   Asthma, chronic   Pregnancy resulting from in vitro fertilization   GDM, class A1   Chronic hypertension with superimposed preeclampsia  CHTN with superimposed PEC resolving        - BP normal to mild range     - great diuresis, lost 12 lbs in last 24 hours,  Anxiety - stable Asthma - stable GDM A1  - f/u PP Hypothyroid - continue current regimen Chronic anemia - asymptomatic, plan iron q day pp  Newborn stable, remains in NICU             DC home today w/ instructions  [facility] and PRN    Neta Mendsaniela C Keary Hanak, CNM, MSN 02/21/2017, 12:05 PM

## 2017-02-21 NOTE — Lactation Note (Signed)
This note was copied from a baby's chart. Lactation Consultation Note  Patient Name: Natasha Georges LynchCheryl Milham VQQVZ'DToday's Date: 02/21/2017 Reason for consult: Follow-up assessment;Primapara;1st time breastfeeding;NICU baby;Infant < 6lbs;Maternal endocrine disorder Type of Endocrine Disorder?: Thyroid(and GDM)  Visited with P1 Mom and FOB on day of Mom's probable discharge from hospital.  Baby 87 hrs old and in the NICU, born at 3925wks.   Mom stated she was able to pump 8 times last 24 hrs.  Taking colostrum to baby in the NICU. Mom reassured and praised for her diligence with regards to hand expression and double pumping.  Encouraged to try to get rest when she goes home.   Mom inquired about when milk volume would start increasing.  Normal range of 2-6 days reported.  Reassured Mom that it may be 5-6 days (due to C/S, pre-eclampsia, hypothyroid, and GDM). Medela DEBP requested from insurance company.  Talked about benefits of renting a Symphony from the gift shop here at hospital.  Mom aware of pumping rooms in the NICU, and benefits of pumping when she is here with baby.  Warm compresses, breast massage, and hand expression recommended along with regular double pumping.  Talked about looking at baby's picture, and smelling a blanket she has laid on to help stimulate her pumping success.    Mom and FOB aware of lactation support available to her and encouraged them to call prn for any concerns.   Interventions Interventions: DEBP;Hand express;Breast massage;Expressed milk  Lactation Tools Discussed/Used Tools: Pump Breast pump type: Double-Electric Breast Pump   Consult Status Consult Status: Complete Date: 02/21/17 Follow-up type: Call as needed    Natasha Caldwell, Natasha Caldwell 02/21/2017, 9:16 AM

## 2017-02-23 ENCOUNTER — Telehealth (HOSPITAL_COMMUNITY): Payer: Self-pay | Admitting: Lactation Services

## 2017-02-23 ENCOUNTER — Ambulatory Visit: Payer: Self-pay

## 2017-02-23 ENCOUNTER — Telehealth: Payer: Self-pay | Admitting: Lactation Services

## 2017-02-23 NOTE — Lactation Note (Signed)
This note was copied from a baby's chart. Lactation Consultation Note  Patient Name: Natasha Caldwell: 02/23/2017 Reason for consult: Follow-up assessment;NICU baby   Mom is concerned with milk production. She is getting only gtts today. and was getting more volume yesterday. Discussed ebb and flow and emotions playing a part in milk production. Mom generally pumps at home and is pumping in the pumping room in NICU for the first time today. Mom is pumping 8 x a day and has tried power pumping once.   Enc mom to keep pumping 8 x a day and to hand express post pumping. Mom reports she is feeling a little fuller today, she denies signs/symptoms of engorgment. Mom with Hypothyroidism and reports her MD is working on regulating her levels. Mom reports she did not have GDM and her husband's vasectomy was the reason for the infertility.   Enc mom to pump with hands on pumping, relax with pumping, have picture of infant or blanket from infant to cover pump parts. Mom is not able to hold infant STS at this time. Enc mom to pump when visiting infant to promote hormonal response with milk production. Enc mom to keep trying. Mom to call with further questions/concerns at this time.   Maternal Data    Feeding    LATCH Score                   Interventions    Lactation Tools Discussed/Used     Consult Status Consult Status: PRN Follow-up type: Call as needed    Ed BlalockSharon S Yama Nielson 02/23/2017, 1:19 PM

## 2017-02-23 NOTE — Telephone Encounter (Signed)
Mom called with questions about getting her milk in. LM for mom to return call and LC will try again later.

## 2017-03-09 ENCOUNTER — Encounter (HOSPITAL_COMMUNITY): Payer: Self-pay | Admitting: *Deleted

## 2017-05-03 ENCOUNTER — Ambulatory Visit: Payer: Self-pay

## 2017-05-03 NOTE — Lactation Note (Addendum)
This note was copied from a baby's chart. Lactation Consultation Note  Patient Name: Natasha Caldwell ZOXWR'UToday's Date: 05/03/2017 Reason for consult: Follow-up assessment  Assisted with positioning and latch. Baby 3811 weeks old, adjusted 6248w4d.  Baby on O2 nasal cannula, and receiving OG feeding of 45 ml EBM+/formula.    Assisted with cross cradle hold.  Mom with soft, compressible breasts, short shafted nipples.  Milk easily hand expressed.  Mom states her milk supply is not very good., expressing 1-2 oz per day.  Rented Symphony DEBP.  (Mom has hypothyroidism, GDM, and AMA).  Mom pumping 7-8 times per 24 hrs, doing some power pumping once a day.  Talked about getting enough sleep, staying well hydrated, and eating a well balanced diet.  Assisted with a 16 mm nipple shield.  Instructed on how to place this, nipple pulled well into shield.  Mom able to bring baby onto breast when she opened widely.  A few sucks noted.  Baby very alert.  Talked with Mom about the steps involved before baby is able to successfully transfer milk at the breast.  For a first try, baby opened wide and latched onto nipple shield deeply, gave a few sucks.  No swallows heard.  Talked about next time, to hold off on OG feed until baby has established latch, and using curved tip syringe, instill a little EBM into nipple shield tip to entice baby to suck.   Mom aware of assistance available.  Recommended she call prn for assistance with Lactation.  Feeding Feeding Type: Breast Fed Length of feed: 2 min  LATCH Score Latch: Repeated attempts needed to sustain latch, nipple held in mouth throughout feeding, stimulation needed to elicit sucking reflex.  Audible Swallowing: None  Type of Nipple: Everted at rest and after stimulation  Comfort (Breast/Nipple): Soft / non-tender  Hold (Positioning): Assistance needed to correctly position infant at breast and maintain latch.  LATCH Score: 6  Interventions Interventions:  Assisted with latch;Breast massage;Hand express;Breast compression;Adjust position;Support pillows;Position options;DEBP;Expressed milk  Lactation Tools Discussed/Used Tools: Nipple Dorris CarnesShields;Pump Nipple shield size: 16 Breast pump type: Double-Electric Breast Pump   Consult Status Consult Status: Follow-up Date: 05/03/17 Follow-up type: Call as needed    [name], [name] 05/03/2017, 3:25 PM

## 2018-07-18 IMAGING — US US MFM OB DETAIL+14 WK
1 series · 14 of 28 positions shown · non-contrast
Comparison: none

[Series 1: us mfm ob detail+14 wk · 72 acquisitions, 14 frames shown]
[im 3/72]
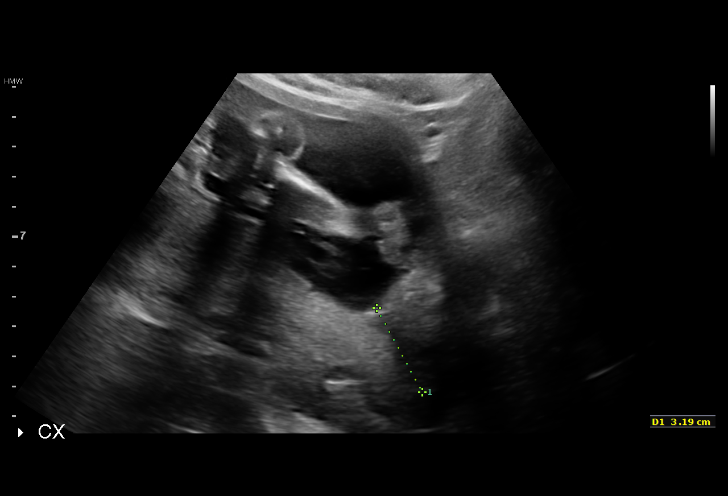
[im 8/72]
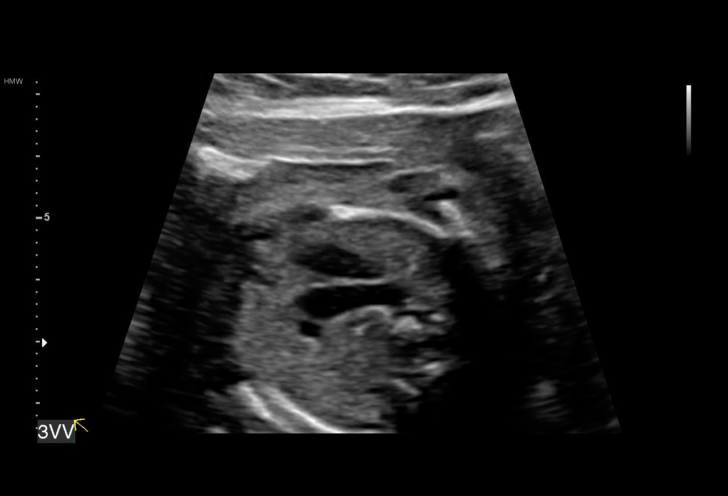
[im 14/72]
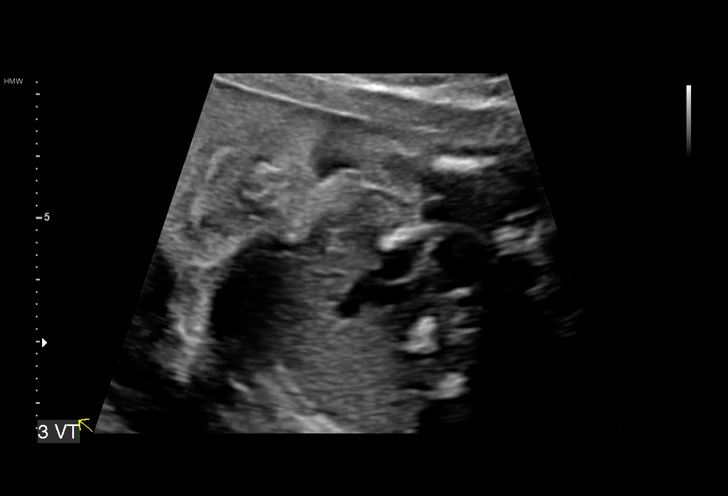
[im 19/72]
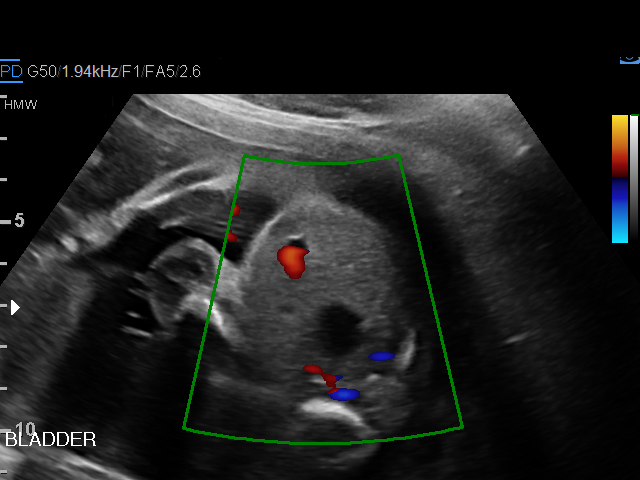
[im 24/72]
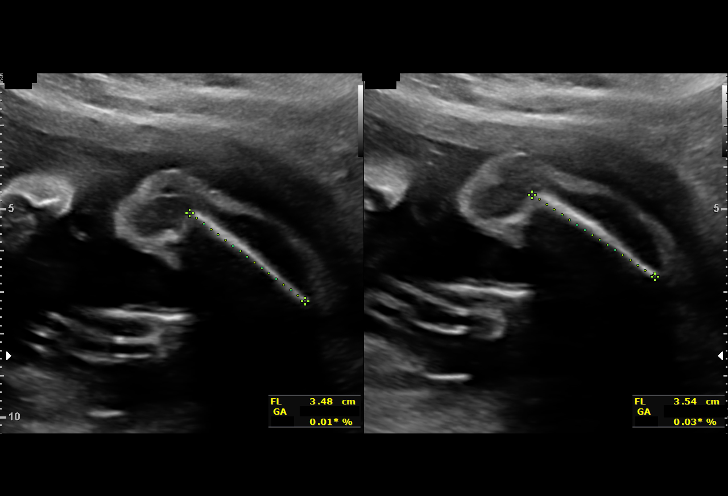
[im 29/72]
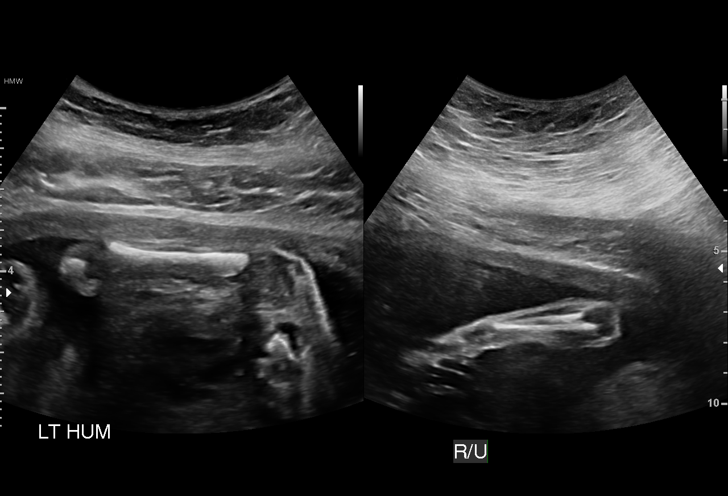
[im 35/72]
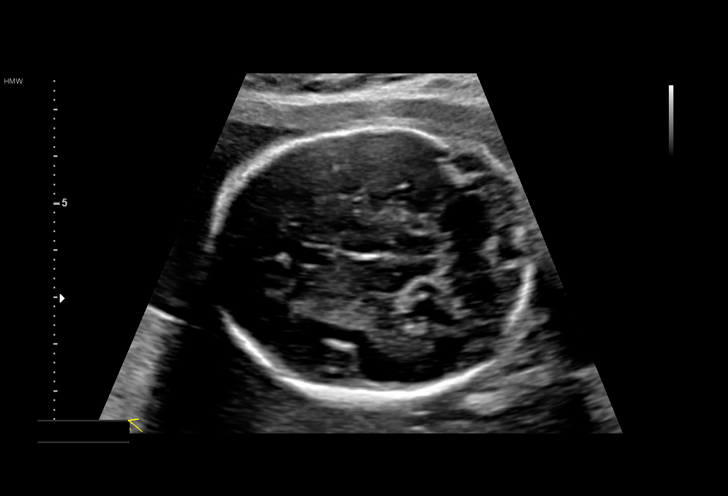
[im 40/72]
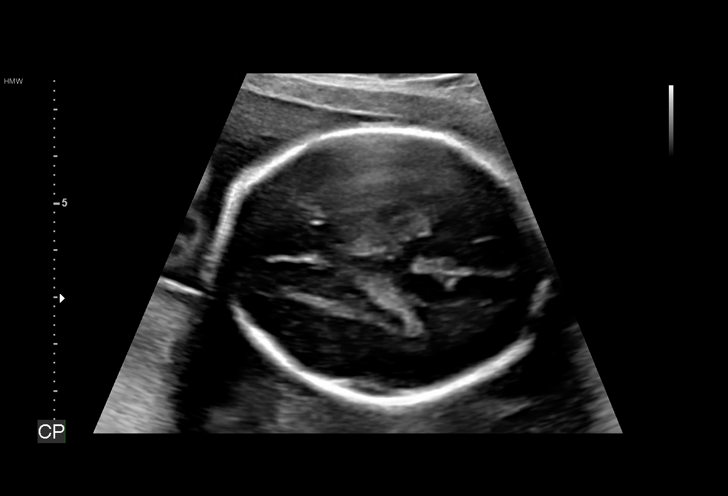
[im 45/72]
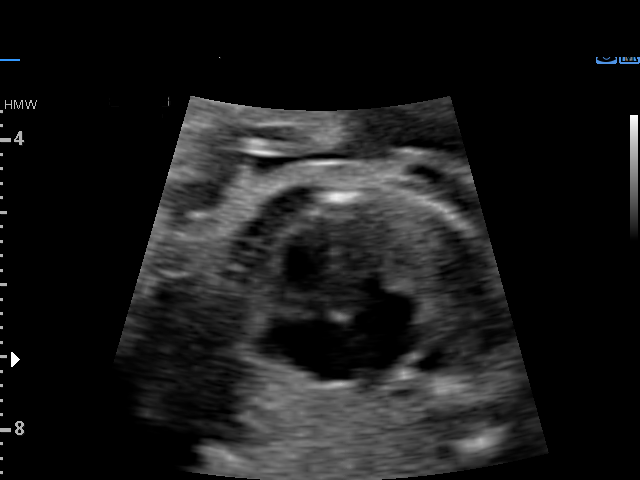
[im 50/72]
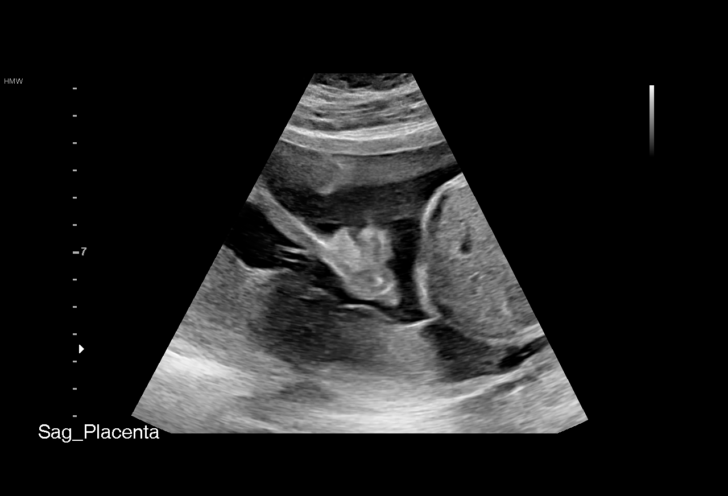
[im 56/72]
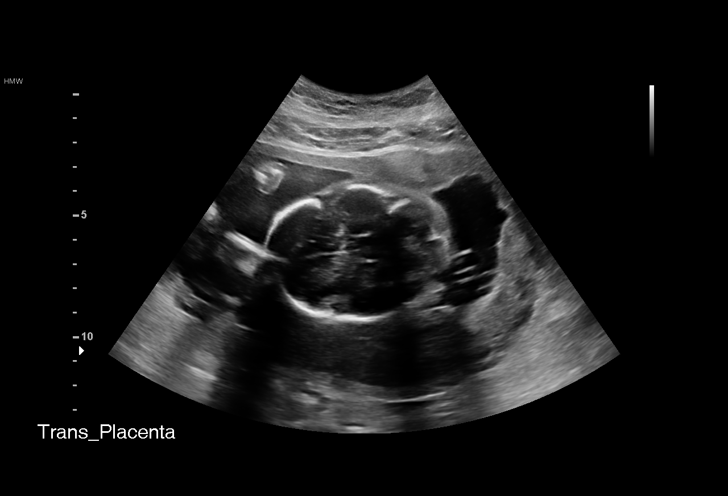
[im 61/72]
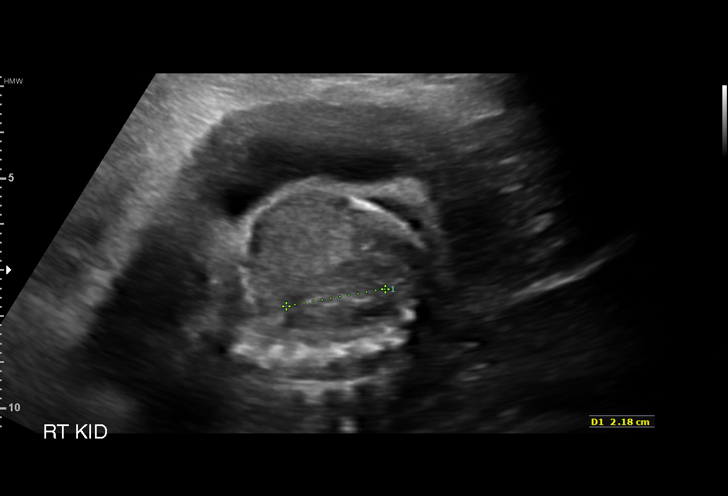
[im 66/72]
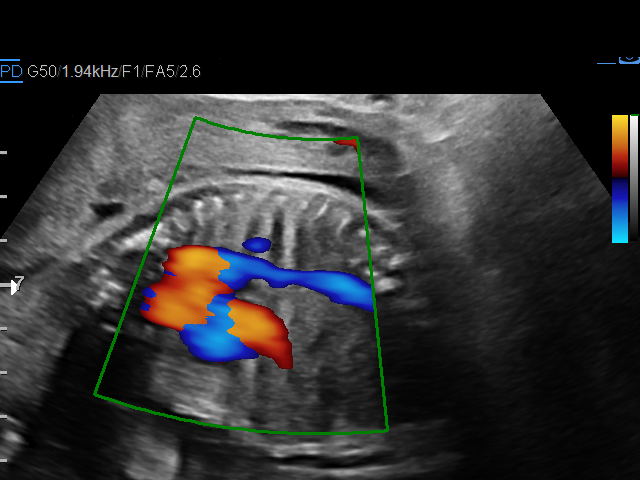
[im 72/72]
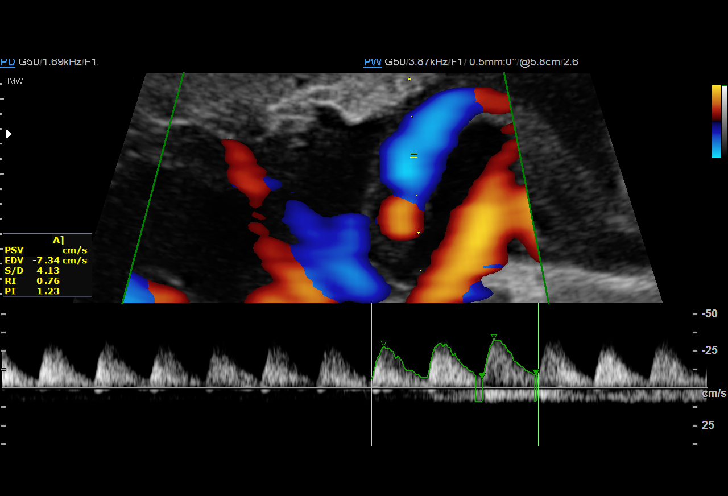

[14 of 28 positions shown; findings below may reference images not displayed]

OB/GYN &
Infertility
[id number] [HOSPITAL]

1  KA CHUNG JEMBER            [id number]      [id number]     [id number]
Indications

24 weeks gestation of pregnancy
Severe preeclampsia, second trimester
Advanced maternal age primigravida 35+,
second trimester
Obesity complicating pregnancy, second
trimester
Pregnancy resulting from assisted
reproductive technology
Encounter for antenatal screening for
malformations
Fetal Evaluation

Num Of Fetuses:     1
Cardiac Activity:   Observed
Presentation:       Breech
Placenta:           Posterior, above cervical os
P. Cord Insertion:  Not well visualized

Amniotic Fluid
AFI FV:      Subjectively within normal limits

Largest Pocket(cm)
4.5
Biometry

BPD:        57  mm     G. Age:  23w 3d          7  %    CI:        72.93   %    70 - 86
FL/HC:      16.5   %    18.7 -
HC:      212.2  mm     G. Age:  23w 2d        < 3  %    HC/AC:      1.10        1.04 -
AC:      192.3  mm     G. Age:  24w 0d         20  %    FL/BPD:     61.6   %    71 - 87
FL:       35.1  mm     G. Age:  21w 0d        < 3  %    FL/AC:      18.3   %    20 - 24
HUM:      34.8  mm     G. Age:  21w 6d        < 5  %

Est. FW:     534  gm      1 lb 3 oz     17  %
Gestational Age

Clinical EDD:  24w 5d                                        EDD:   06/03/17
U/S Today:     23w 0d                                        EDD:   06/15/17
Best:          24w 5d     Det. By:  Clinical EDD             EDD:   06/03/17
Anatomy

Cranium:               Appears normal         Aortic Arch:            Appears normal
Cavum:                 Appears normal         Ductal Arch:            Not well visualized
Ventricles:            Appears normal         Diaphragm:              Not well visualized
Choroid Plexus:        Appears normal         Stomach:                Appears normal, left
sided
Cerebellum:            Appears normal         Abdomen:                Appears normal
Posterior Fossa:       Appears normal         Abdominal Wall:         Appears nml (cord
insert, abd wall)
Nuchal Fold:           Not applicable (>20    Cord Vessels:           Appears normal (3
wks GA)                                        vessel cord)
Face:                  Orbits nl; profile not Kidneys:                Not well visualized
well visualized
Lips:                  Appears normal         Bladder:                Appears normal
Thoracic:              Appears normal         Spine:                  Not well visualized
Heart:                 Appears normal         Upper Extremities:      Appears normal
(4CH, axis, and situs
RVOT:                  Appears normal         Lower Extremities:      Appears normal
LVOT:                  Appears normal

Other:  Heels visualized. Technically difficult due to maternal habitus and
fetal position.
Cervix Uterus Adnexa

Cervix
Length:              3  cm.
Normal appearance by transabdominal scan.

Uterus
No abnormality visualized.

Left Ovary
No adnexal mass visualized.

Right Ovary
No adnexal mass visualized.

Cul De Sac:   No free fluid seen.

Adnexa:       No abnormality visualized.
Impression

Singleton intrauterine pregnancy at 24 weeks 5 days
gestation with fetal cardiac activity
Breech presentation
Normal detailed fetal anatomy; limited views of profile, DA,
diaphragm, kidneys and spine
Normal amniotic fluid volume
Measurements consistent with conception dating
Posterior placenta
Normal appearing cervical length
Recommendations

Recommend follow-up ultrasound examination in 
2 weeks

## 2020-06-20 NOTE — Telephone Encounter (Signed)
Opened in error
